# Patient Record
Sex: Male | Born: 1945 | Race: White | Hispanic: No | Marital: Married | Smoking: Never smoker
Health system: Southern US, Community
[De-identification: ages and names within clinical notes are randomized; demographics above are authoritative.]

## PROBLEM LIST (undated history)

## (undated) ENCOUNTER — Ambulatory Visit: Discharge status: Absent without leave | Payer: Medicare Other

## (undated) DIAGNOSIS — K209 Esophagitis, unspecified without bleeding: Secondary | ICD-10-CM

## (undated) DIAGNOSIS — E785 Hyperlipidemia, unspecified: Secondary | ICD-10-CM

## (undated) DIAGNOSIS — N529 Male erectile dysfunction, unspecified: Secondary | ICD-10-CM

## (undated) DIAGNOSIS — E119 Type 2 diabetes mellitus without complications: Secondary | ICD-10-CM

## (undated) DIAGNOSIS — H409 Unspecified glaucoma: Secondary | ICD-10-CM

## (undated) DIAGNOSIS — C801 Malignant (primary) neoplasm, unspecified: Secondary | ICD-10-CM

## (undated) DIAGNOSIS — K219 Gastro-esophageal reflux disease without esophagitis: Secondary | ICD-10-CM

## (undated) DIAGNOSIS — I10 Essential (primary) hypertension: Secondary | ICD-10-CM

## (undated) DIAGNOSIS — G4730 Sleep apnea, unspecified: Secondary | ICD-10-CM

## (undated) DIAGNOSIS — N139 Obstructive and reflux uropathy, unspecified: Secondary | ICD-10-CM

## (undated) HISTORY — PX: PROSTATE CRYOABLATION: SUR358

## (undated) HISTORY — PX: EYE SURGERY: SHX253

## (undated) HISTORY — PX: COLONOSCOPY: SHX174

---

## 2006-01-08 ENCOUNTER — Ambulatory Visit: Payer: Self-pay | Admitting: Family Medicine

## 2006-01-28 ENCOUNTER — Ambulatory Visit: Payer: Self-pay | Admitting: Psychiatry

## 2007-02-15 ENCOUNTER — Ambulatory Visit: Payer: Self-pay | Admitting: Gastroenterology

## 2007-03-15 DIAGNOSIS — C4491 Basal cell carcinoma of skin, unspecified: Secondary | ICD-10-CM

## 2007-03-15 HISTORY — DX: Basal cell carcinoma of skin, unspecified: C44.91

## 2007-10-31 DIAGNOSIS — L570 Actinic keratosis: Secondary | ICD-10-CM

## 2007-10-31 HISTORY — DX: Actinic keratosis: L57.0

## 2008-08-15 ENCOUNTER — Ambulatory Visit: Payer: Self-pay | Admitting: Urology

## 2008-10-10 ENCOUNTER — Ambulatory Visit: Payer: Self-pay | Admitting: Urology

## 2008-10-21 ENCOUNTER — Ambulatory Visit: Payer: Self-pay | Admitting: Urology

## 2009-10-23 ENCOUNTER — Ambulatory Visit: Payer: Self-pay | Admitting: *Deleted

## 2010-05-08 ENCOUNTER — Ambulatory Visit: Payer: Self-pay | Admitting: Internal Medicine

## 2010-12-17 ENCOUNTER — Ambulatory Visit: Payer: Self-pay | Admitting: Internal Medicine

## 2010-12-24 ENCOUNTER — Ambulatory Visit: Payer: Self-pay | Admitting: Internal Medicine

## 2011-07-19 ENCOUNTER — Ambulatory Visit: Payer: Self-pay | Admitting: Ophthalmology

## 2011-09-27 ENCOUNTER — Ambulatory Visit: Payer: Self-pay | Admitting: Ophthalmology

## 2012-12-10 DIAGNOSIS — C61 Malignant neoplasm of prostate: Secondary | ICD-10-CM | POA: Insufficient documentation

## 2012-12-10 DIAGNOSIS — N529 Male erectile dysfunction, unspecified: Secondary | ICD-10-CM | POA: Insufficient documentation

## 2012-12-10 DIAGNOSIS — R339 Retention of urine, unspecified: Secondary | ICD-10-CM | POA: Insufficient documentation

## 2012-12-10 DIAGNOSIS — N393 Stress incontinence (female) (male): Secondary | ICD-10-CM | POA: Insufficient documentation

## 2013-07-18 DIAGNOSIS — N139 Obstructive and reflux uropathy, unspecified: Secondary | ICD-10-CM | POA: Insufficient documentation

## 2013-07-18 DIAGNOSIS — R399 Unspecified symptoms and signs involving the genitourinary system: Secondary | ICD-10-CM | POA: Insufficient documentation

## 2014-07-23 DIAGNOSIS — Z8546 Personal history of malignant neoplasm of prostate: Secondary | ICD-10-CM | POA: Insufficient documentation

## 2014-08-18 DIAGNOSIS — E119 Type 2 diabetes mellitus without complications: Secondary | ICD-10-CM | POA: Insufficient documentation

## 2014-08-18 DIAGNOSIS — I10 Essential (primary) hypertension: Secondary | ICD-10-CM | POA: Insufficient documentation

## 2014-08-18 DIAGNOSIS — H409 Unspecified glaucoma: Secondary | ICD-10-CM | POA: Insufficient documentation

## 2014-08-18 DIAGNOSIS — E785 Hyperlipidemia, unspecified: Secondary | ICD-10-CM | POA: Insufficient documentation

## 2014-08-18 DIAGNOSIS — K219 Gastro-esophageal reflux disease without esophagitis: Secondary | ICD-10-CM | POA: Insufficient documentation

## 2016-10-08 ENCOUNTER — Encounter: Payer: Self-pay | Admitting: Gynecology

## 2016-10-08 ENCOUNTER — Ambulatory Visit
Admission: EM | Admit: 2016-10-08 | Discharge: 2016-10-08 | Disposition: A | Discharge status: Home / Self Care | Payer: Medicare Other | Attending: Family Medicine | Admitting: Family Medicine

## 2016-10-08 DIAGNOSIS — M5431 Sciatica, right side: Secondary | ICD-10-CM

## 2016-10-08 HISTORY — DX: Sleep apnea, unspecified: G47.30

## 2016-10-08 HISTORY — DX: Hyperlipidemia, unspecified: E78.5

## 2016-10-08 HISTORY — DX: Essential (primary) hypertension: I10

## 2016-10-08 HISTORY — DX: Obstructive and reflux uropathy, unspecified: N13.9

## 2016-10-08 HISTORY — DX: Male erectile dysfunction, unspecified: N52.9

## 2016-10-08 HISTORY — DX: Type 2 diabetes mellitus without complications: E11.9

## 2016-10-08 HISTORY — DX: Malignant (primary) neoplasm, unspecified: C80.1

## 2016-10-08 HISTORY — DX: Gastro-esophageal reflux disease without esophagitis: K21.9

## 2016-10-08 MED ORDER — PREDNISONE 20 MG PO TABS: 20.0000 mg | ORAL_TABLET | Freq: Every day | ORAL | 0 refills | Status: DC

## 2016-10-08 MED ORDER — HYDROCODONE-ACETAMINOPHEN 5-325 MG PO TABS: ORAL_TABLET | ORAL | 0 refills | Status: DC

## 2016-10-08 NOTE — ED Provider Notes (Signed)
[facility]    CSN: [id number] Arrival date & time: 10/08/16  0850     History   Chief Complaint Chief Complaint  Patient presents with  . Leg Pain    HPI Nicholas Acevedo is a 70 y.o. male.   70 yo male with a 2 days h/o right hip, buttock pain radiating down to the knee. States symptoms began when he was sitting on the commode. Denies any injuries, falls, fevers, chills, numbness/tingling, difficulty with bowels or bladder.    The history is provided by the patient.  Leg Pain    Past Medical History:  Diagnosis Date  . Cancer [facility])    prostate  . Diabetes mellitus without complication ([place])   . ED (erectile dysfunction)   . GERD (gastroesophageal reflux disease)   . Hyperlipemia   . Hypertension   . Impotence of organic origin   . Sleep apnea   . Urinary obstruction     There are no active problems to display for this patient.   Past Surgical History:  Procedure Laterality Date  . EYE SURGERY    . PROSTATE CRYOABLATION         Home Medications    Prior to Admission medications   Medication Sig Start Date End Date Taking? Authorizing Provider  aspirin EC 81 MG tablet Take 81 mg by mouth daily.   Yes Historical Provider, MD  brimonidine (ALPHAGAN) 0.2 % ophthalmic solution 3 (three) times daily.   Yes Historical Provider, MD  losartan (COZAAR) 50 MG tablet Take 50 mg by mouth daily.   Yes Historical Provider, MD  omeprazole (PRILOSEC) 40 MG capsule Take 40 mg by mouth daily.   Yes Historical Provider, MD  simvastatin (ZOCOR) 20 MG tablet Take 20 mg by mouth daily.   Yes Historical Provider, MD  sitaGLIPtin (JANUVIA) 25 MG tablet Take 25 mg by mouth daily.   Yes Historical Provider, MD  tamsulosin (FLOMAX) 0.4 MG CAPS capsule Take 0.4 mg by mouth.   Yes Historical Provider, MD  HYDROcodone-acetaminophen (NORCO/VICODIN) 5-325 MG tablet 1-2 tabs po q 8 hours prn 10/08/16   Norval Gable, MD  predniSONE (DELTASONE) 20 MG tablet Take 1 tablet (20  mg total) by mouth daily. 10/08/16   Norval Gable, MD    Family History No family history on file.  Social History Social History  Substance Use Topics  . Smoking status: Never Smoker  . Smokeless tobacco: Never Used  . Alcohol use No     Allergies   Review of patient's allergies indicates no known allergies.   Review of Systems Review of Systems   Physical Exam Triage Vital Signs ED Triage Vitals  Enc Vitals Group     BP 10/08/16 0940 (!) 160/89     Pulse Rate 10/08/16 0940 66     Resp 10/08/16 0940 16     Temp 10/08/16 0940 98.2 F (36.8 C)     Temp Source 10/08/16 0940 Oral     SpO2 10/08/16 0940 98 %     Weight 10/08/16 0942 277 lb (125.6 kg)     Height 10/08/16 0942 5\' 11"  (1.803 m)     Head Circumference --      Peak Flow --      Pain Score 10/08/16 0948 8     Pain Loc --      Pain Edu? --      Excl. in [place]? --    No data found.   Updated Vital Signs BP Marland Kitchen)  160/89 (BP Location: Left Arm)   Pulse 66   Temp 98.2 F (36.8 C) (Oral)   Resp 16   Ht 5\' 11"  (1.803 m)   Wt 277 lb (125.6 kg)   SpO2 98%   BMI 38.63 kg/m   Visual Acuity Right Eye Distance:   Left Eye Distance:   Bilateral Distance:    Right Eye Near:   Left Eye Near:    Bilateral Near:     Physical Exam  Constitutional: He appears well-developed and well-nourished. No distress.  Neck: Normal range of motion. Neck supple. No tracheal deviation present.  Pulmonary/Chest: Effort normal. No stridor. No respiratory distress.  Musculoskeletal:       Cervical back: He exhibits no tenderness, no bony tenderness, no swelling, no edema, no deformity, no laceration, no pain, no spasm and normal pulse.       Lumbar back: He exhibits tenderness (over the right lumbar paraspinous and buttock) and spasm. He exhibits normal range of motion, no bony tenderness, no swelling, no edema, no deformity, no laceration, no pain and normal pulse.  Neurological: He is alert. He has normal reflexes. He  displays normal reflexes. He exhibits normal muscle tone. Coordination normal.  Skin: No rash noted. He is not diaphoretic.  Nursing note and vitals reviewed.    UC Treatments / Results  Labs (all labs ordered are listed, but only abnormal results are displayed) Labs Reviewed - No data to display  EKG  EKG Interpretation None       Radiology No results found.  Procedures Procedures (including critical care time)  Medications Ordered in UC Medications - No data to display   Initial Impression / Assessment and Plan / UC Course  I have reviewed the triage vital signs and the nursing notes.  Pertinent labs & imaging results that were available during my care of the patient were reviewed by me and considered in my medical decision making (see chart for details).  Clinical Course      Final Clinical Impressions(s) / UC Diagnoses   Final diagnoses:  Sciatica of right side    New Prescriptions Discharge Medication List as of 10/08/2016 10:08 AM    START taking these medications   Details  HYDROcodone-acetaminophen (NORCO/VICODIN) 5-325 MG tablet 1-2 tabs po q 8 hours prn, Print    predniSONE (DELTASONE) 20 MG tablet Take 1 tablet (20 mg total) by mouth daily., Starting Sat 10/08/2016, Normal       1. diagnosis reviewed with patient 2. rx as per orders above; reviewed possible side effects, interactions, risks and benefits  3. Recommend supportive treatment with heat/ice, stretches 4. Follow-up prn if symptoms worsen or don't improve   Norval Gable, MD 10/08/16 1143

## 2016-10-08 NOTE — ED Triage Notes (Signed)
Per patient c/o pain start in right hip rotate down to his right leg.

## 2016-11-30 ENCOUNTER — Other Ambulatory Visit: Payer: Self-pay | Admitting: Family Medicine

## 2016-11-30 DIAGNOSIS — M5440 Lumbago with sciatica, unspecified side: Principal | ICD-10-CM

## 2016-11-30 DIAGNOSIS — G8929 Other chronic pain: Secondary | ICD-10-CM

## 2016-12-15 ENCOUNTER — Ambulatory Visit
Admission: RE | Admit: 2016-12-15 | Discharge: 2016-12-15 | Disposition: A | Discharge status: Home / Self Care | Payer: Medicare Other | Source: Ambulatory Visit | Attending: Family Medicine | Admitting: Family Medicine

## 2016-12-15 DIAGNOSIS — Q7649 Other congenital malformations of spine, not associated with scoliosis: Secondary | ICD-10-CM | POA: Diagnosis not present

## 2016-12-15 DIAGNOSIS — M48061 Spinal stenosis, lumbar region without neurogenic claudication: Secondary | ICD-10-CM | POA: Insufficient documentation

## 2016-12-15 DIAGNOSIS — G8929 Other chronic pain: Secondary | ICD-10-CM | POA: Diagnosis not present

## 2016-12-15 DIAGNOSIS — M5126 Other intervertebral disc displacement, lumbar region: Secondary | ICD-10-CM | POA: Diagnosis not present

## 2016-12-15 DIAGNOSIS — M5440 Lumbago with sciatica, unspecified side: Secondary | ICD-10-CM | POA: Diagnosis not present

## 2017-09-11 ENCOUNTER — Encounter: Payer: Self-pay | Admitting: *Deleted

## 2017-09-12 ENCOUNTER — Ambulatory Visit: Payer: Medicare Other | Admitting: Anesthesiology

## 2017-09-12 ENCOUNTER — Encounter
Admission: RE | Disposition: A | Discharge status: Home / Self Care | Payer: Self-pay | Source: Ambulatory Visit | Attending: Gastroenterology

## 2017-09-12 ENCOUNTER — Ambulatory Visit
Admission: RE | Admit: 2017-09-12 | Discharge: 2017-09-12 | Disposition: A | Discharge status: Home / Self Care | Payer: Medicare Other | Source: Ambulatory Visit | Attending: Gastroenterology | Admitting: Gastroenterology

## 2017-09-12 DIAGNOSIS — Q439 Congenital malformation of intestine, unspecified: Secondary | ICD-10-CM | POA: Insufficient documentation

## 2017-09-12 DIAGNOSIS — I10 Essential (primary) hypertension: Secondary | ICD-10-CM | POA: Diagnosis not present

## 2017-09-12 DIAGNOSIS — Z8546 Personal history of malignant neoplasm of prostate: Secondary | ICD-10-CM | POA: Diagnosis not present

## 2017-09-12 DIAGNOSIS — Z7984 Long term (current) use of oral hypoglycemic drugs: Secondary | ICD-10-CM | POA: Insufficient documentation

## 2017-09-12 DIAGNOSIS — Z9989 Dependence on other enabling machines and devices: Secondary | ICD-10-CM | POA: Diagnosis not present

## 2017-09-12 DIAGNOSIS — E119 Type 2 diabetes mellitus without complications: Secondary | ICD-10-CM | POA: Diagnosis not present

## 2017-09-12 DIAGNOSIS — Z79899 Other long term (current) drug therapy: Secondary | ICD-10-CM | POA: Insufficient documentation

## 2017-09-12 DIAGNOSIS — E785 Hyperlipidemia, unspecified: Secondary | ICD-10-CM | POA: Diagnosis not present

## 2017-09-12 DIAGNOSIS — K5730 Diverticulosis of large intestine without perforation or abscess without bleeding: Secondary | ICD-10-CM | POA: Insufficient documentation

## 2017-09-12 DIAGNOSIS — D120 Benign neoplasm of cecum: Secondary | ICD-10-CM | POA: Diagnosis not present

## 2017-09-12 DIAGNOSIS — Z7982 Long term (current) use of aspirin: Secondary | ICD-10-CM | POA: Diagnosis not present

## 2017-09-12 DIAGNOSIS — K219 Gastro-esophageal reflux disease without esophagitis: Secondary | ICD-10-CM | POA: Insufficient documentation

## 2017-09-12 DIAGNOSIS — H409 Unspecified glaucoma: Secondary | ICD-10-CM | POA: Insufficient documentation

## 2017-09-12 DIAGNOSIS — Z1211 Encounter for screening for malignant neoplasm of colon: Secondary | ICD-10-CM | POA: Insufficient documentation

## 2017-09-12 DIAGNOSIS — G4730 Sleep apnea, unspecified: Secondary | ICD-10-CM | POA: Insufficient documentation

## 2017-09-12 HISTORY — DX: Unspecified glaucoma: H40.9

## 2017-09-12 HISTORY — PX: COLONOSCOPY WITH PROPOFOL: SHX5780

## 2017-09-12 HISTORY — DX: Esophagitis, unspecified without bleeding: K20.90

## 2017-09-12 HISTORY — DX: Esophagitis, unspecified: K20.9

## 2017-09-12 LAB — GLUCOSE, CAPILLARY: GLUCOSE-CAPILLARY: 116 mg/dL — AB (ref 65–99)

## 2017-09-12 SURGERY — COLONOSCOPY WITH PROPOFOL
Anesthesia: General

## 2017-09-12 MED ORDER — LIDOCAINE HCL (PF) 2 % IJ SOLN
INTRAMUSCULAR | Status: AC
  Filled 2017-09-12: qty 4

## 2017-09-12 MED ORDER — PROPOFOL 500 MG/50ML IV EMUL
INTRAVENOUS | Status: DC | PRN
  Administered 2017-09-12: 125 ug/kg/min via INTRAVENOUS

## 2017-09-12 MED ORDER — SODIUM CHLORIDE 0.9 % IV SOLN
INTRAVENOUS | Status: DC
  Administered 2017-09-12: 09:00:00 via INTRAVENOUS

## 2017-09-12 MED ORDER — PROPOFOL 500 MG/50ML IV EMUL
INTRAVENOUS | Status: AC
  Filled 2017-09-12: qty 50

## 2017-09-12 MED ORDER — PROPOFOL 10 MG/ML IV BOLUS
INTRAVENOUS | Status: DC | PRN
  Administered 2017-09-12: 100 mg via INTRAVENOUS

## 2017-09-12 MED ORDER — LIDOCAINE HCL (CARDIAC) 20 MG/ML IV SOLN
INTRAVENOUS | Status: DC | PRN
  Administered 2017-09-12: 80 mg via INTRAVENOUS

## 2017-09-12 NOTE — Anesthesia Preprocedure Evaluation (Signed)
Anesthesia Evaluation  Patient identified by MRN, date of birth, ID band Patient awake    Reviewed: Allergy & Precautions, H&P , NPO status , Patient's Chart, lab work & pertinent test results, reviewed documented beta blocker date and time   Airway Mallampati: III  TM Distance: >3 FB Neck ROM: full    Dental  (+) Partial Upper, Partial Lower   Pulmonary neg shortness of breath, sleep apnea and Continuous Positive Airway Pressure Ventilation , neg COPD, neg recent URI,           Cardiovascular Exercise Tolerance: Good hypertension, (-) angina(-) CAD, (-) Past MI, (-) Cardiac Stents and (-) CABG (-) dysrhythmias (-) Valvular Problems/Murmurs     Neuro/Psych negative neurological ROS  negative psych ROS   GI/Hepatic Neg liver ROS, GERD  ,  Endo/Other  diabetes  Renal/GU negative Renal ROS  negative genitourinary   Musculoskeletal   Abdominal   Peds  Hematology negative hematology ROS (+)   Anesthesia Other Findings Past Medical History: No date: Cancer ([place])     Comment:  prostate No date: Diabetes mellitus without complication ([facility]) No date: ED (erectile dysfunction) No date: Esophagitis No date: GERD (gastroesophageal reflux disease) No date: Glaucoma No date: Hyperlipemia No date: Hypertension 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 No date: Sleep apnea No date: Urinary obstruction   Reproductive/Obstetrics negative OB ROS                             Anesthesia Physical Anesthesia Plan  ASA: III  Anesthesia Plan: General   Post-op Pain Management:    Induction: Intravenous  PONV Risk Score and Plan: 2 and Propofol infusion  Airway Management Planned: Natural Airway and Nasal Cannula  Additional Equipment:   Intra-op Plan:   Post-operative Plan:   Informed Consent: I have reviewed the patients History and Physical, chart, labs and discussed the procedure including the  risks, benefits and alternatives for the proposed anesthesia with the patient or authorized representative who has indicated his/her understanding and acceptance.   Dental Advisory Given  Plan Discussed with: Anesthesiologist, CRNA and Surgeon  Anesthesia Plan Comments:         Anesthesia Quick Evaluation

## 2017-09-12 NOTE — H&P (Signed)
Outpatient short stay form Pre-procedure 09/12/2017 9:55 AM Lollie Sails MD  Primary Physician: Dr. Thereasa Distance  Reason for visit:  Colonoscopy  History of present illness:  Patient is a 71 year old male presenting today as above. His last colonoscopy was about 10 years ago by Dr. Sonny Masters. He tolerated his prep well. He does take 81 mg aspirin but has not for a couple days. He takes no other aspirin products or blood thinning agents.    Current Facility-Administered Medications:  .  0.9 %  sodium chloride infusion, , Intravenous, Continuous, Lollie Sails, MD, Last Rate: 20 mL/hr at 09/12/17 7616  Prescriptions Prior to Admission  Medication Sig Dispense Refill Last Dose  . aspirin EC 81 MG tablet Take 81 mg by mouth daily.   09/11/2017 at Unknown time  . brimonidine (ALPHAGAN) 0.2 % ophthalmic solution 3 (three) times daily.   09/11/2017 at Unknown time  . latanoprost (XALATAN) 0.005 % ophthalmic solution 1 drop at bedtime.   09/11/2017 at Unknown time  . losartan (COZAAR) 50 MG tablet Take 50 mg by mouth daily.   09/11/2017 at Unknown time  . metFORMIN (GLUCOPHAGE) 1000 MG tablet Take 1,000 mg by mouth 2 (two) times daily with a meal.   09/11/2017 at Unknown time  . omeprazole (PRILOSEC) 40 MG capsule Take 40 mg by mouth daily.   09/11/2017 at Unknown time  . simvastatin (ZOCOR) 20 MG tablet Take 20 mg by mouth daily.   09/11/2017 at Unknown time  . sitaGLIPtin (JANUVIA) 25 MG tablet Take 25 mg by mouth daily.   09/11/2017 at Unknown time  . tamsulosin (FLOMAX) 0.4 MG CAPS capsule Take 0.4 mg by mouth.   09/11/2017 at Unknown time  . HYDROcodone-acetaminophen (NORCO/VICODIN) 5-325 MG tablet 1-2 tabs po q 8 hours prn (Patient not taking: Reported on 09/12/2017) 12 tablet 0 Completed Course at Unknown time  . predniSONE (DELTASONE) 20 MG tablet Take 1 tablet (20 mg total) by mouth daily. (Patient not taking: Reported on 09/12/2017) 5 tablet 0 Completed Course at Unknown time     No  Known Allergies   Past Medical History:  Diagnosis Date  . Cancer [facility])    prostate  . Diabetes mellitus without complication ([place])   . ED (erectile dysfunction)   . Esophagitis   . GERD (gastroesophageal reflux disease)   . Glaucoma   . Hyperlipemia   . Hypertension   . Impotence of organic origin   . Sleep apnea   . Urinary obstruction     Review of systems:      Physical Exam    Heart and lungs: Regular rate and rhythm without rub or gallop, lungs are bilaterally clear.    HEENT: Normocephalic atraumatic eyes are anicteric    Other:     Pertinant exam for procedure: Soft nontender nondistended bowel sounds positive normoactive    Planned proceedures: Colonoscopy and indicated procedures.  Lollie Sails, MD Gastroenterology 09/12/2017  9:55 AM

## 2017-09-12 NOTE — Op Note (Signed)
[facility] Patient Name: Nicholas Acevedo Procedure Date: 09/12/2017 9:56 AM MRN: [id number] Account #: [id number] Date of Birth: [birth year] Admit Type: Outpatient Age: 72 Room: [facility] ENDO ROOM 3 Gender: Male Note Status: Finalized Procedure:            Colonoscopy Indications:          Screening for colorectal malignant neoplasm Providers:            Lollie Sails, MD Referring MD:         [name] (Referring MD) Medicines:            Monitored Anesthesia Care Complications:        No immediate complications. Procedure:            Pre-Anesthesia Assessment:                       - ASA Grade Assessment: III - A patient with severe                        systemic disease.                       After obtaining informed consent, the colonoscope was                        passed under direct vision. Throughout the procedure,                        the patient's blood pressure, pulse, and oxygen                        saturations were monitored continuously. The                        Colonoscope was introduced through the anus and                        advanced to the the cecum, identified by appendiceal                        orifice and ileocecal valve. The colonoscopy was                        unusually difficult due to multiple diverticula in the                        colon and a tortuous colon. Successful completion of                        the procedure was aided by lavage. The patient                        tolerated the procedure well. The quality of the bowel                        preparation was good except the sigmoid colon was fair. Findings:      Multiple small and large-mouthed diverticula were found in the sigmoid       colon and descending colon.      A 1 mm polyp was found in the cecum. The  polyp was sessile. The polyp       was removed with a cold biopsy forceps. Resection and retrieval were       complete.  The retroflexed view of the distal rectum and anal verge was normal and       showed no anal or rectal abnormalities.      The digital rectal exam was normal. Impression:           - Diverticulosis in the sigmoid colon and in the                        descending colon.                       - One 1 mm polyp in the cecum, removed with a cold                        biopsy forceps. Resected and retrieved.                       - The distal rectum and anal verge are normal on                        retroflexion view. Recommendation:       - Discharge patient to home.                       - Telephone GI clinic for pathology results in 1 week. Procedure Code(s):    --- Professional ---                       [phone], Colonoscopy, flexible; with biopsy, single or                        multiple Diagnosis Code(s):    --- Professional ---                       Z12.11, Encounter for screening for malignant neoplasm                        of colon                       D12.0, Benign neoplasm of cecum                       K57.30, Diverticulosis of large intestine without                        perforation or abscess without bleeding CPT copyright 2016 American [employer] Association. All rights reserved. The codes documented in this report are preliminary and upon coder review may  be revised to meet current compliance requirements. Lollie Sails, MD 09/12/2017 10:39:14 AM This report has been signed electronically. Number of Addenda: 0 Note Initiated On: 09/12/2017 9:56 AM Scope Withdrawal Time: 0 hours 10 minutes 55 seconds  Total Procedure Duration: 0 hours 25 minutes 5 seconds       [facility]

## 2017-09-12 NOTE — Transfer of Care (Signed)
Immediate Anesthesia Transfer of Care Note  Patient: Nicholas Acevedo  Procedure(s) Performed: Procedure(s): COLONOSCOPY WITH PROPOFOL (N/A)  Patient Location: Endoscopy Unit  Anesthesia Type:General  Level of Consciousness: sedated  Airway & Oxygen Therapy: Patient Spontanous Breathing and Patient connected to nasal cannula oxygen  Post-op Assessment: Report given to RN and Post -op Vital signs reviewed and stable  Post vital signs: Reviewed and stable  Last Vitals:  Vitals:   09/12/17 0924 09/12/17 1041  BP: (!) 144/92 104/63  Pulse: 65 66  Resp: 18 10  Temp: (!) 35.8 C (!) 35.9 C  SpO2: 95% 98%    Last Pain:  Vitals:   09/12/17 1041  TempSrc: Tympanic         Complications: No apparent anesthesia complications

## 2017-09-12 NOTE — Anesthesia Post-op Follow-up Note (Signed)
Anesthesia QCDR form completed.        

## 2017-09-13 ENCOUNTER — Encounter: Payer: Self-pay | Admitting: Gastroenterology

## 2017-09-13 LAB — SURGICAL PATHOLOGY

## 2017-09-13 NOTE — Anesthesia Postprocedure Evaluation (Signed)
Anesthesia Post Note  Patient: SANDERS MANNINEN  Procedure(s) Performed: Procedure(s) (LRB): COLONOSCOPY WITH PROPOFOL (N/A)  Patient location during evaluation: Endoscopy Anesthesia Type: General Level of consciousness: awake and alert Pain management: pain level controlled Vital Signs Assessment: post-procedure vital signs reviewed and stable Respiratory status: spontaneous breathing, nonlabored ventilation, respiratory function stable and patient connected to nasal cannula oxygen Cardiovascular status: blood pressure returned to baseline and stable Postop Assessment: no apparent nausea or vomiting Anesthetic complications: no     Last Vitals:  Vitals:   09/12/17 1101 09/12/17 1111  BP: (!) 138/93 (!) 147/90  Pulse: (!) 59 (!) 59  Resp: 20 18  Temp:    SpO2: 98% 100%    Last Pain:  Vitals:   09/12/17 1041  TempSrc: Tympanic                 Martha Clan

## 2018-09-13 IMAGING — MR MR LUMBAR SPINE W/O CM
4 of 5 series · 24 of 48 positions shown · non-contrast
Comparison: 05/08/2010 plain film exam of the lumbar spine.

CLINICAL DATA: 70-year-old male with right hip pain for 9 weeks. No
known injury. Prostate cancer. Initial encounter.

EXAM:
MRI LUMBAR SPINE WITHOUT CONTRAST
TECHNIQUE: Multiplanar, multisequence MR imaging of the lumbar spine was
performed. No intravenous contrast was administered.

[Series 2: T2 · sagittal · 4.0mm · 0.81mm/px · 6 of 15 slices shown (1 of 2)]
[im 1/15]
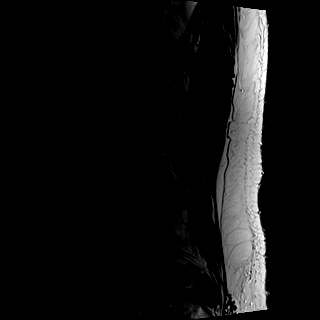
[im 3/15]
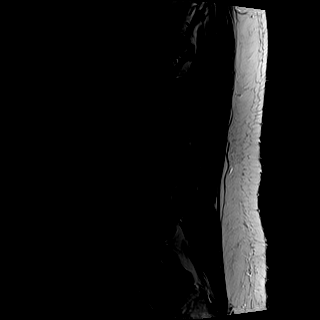
[im 6/15]
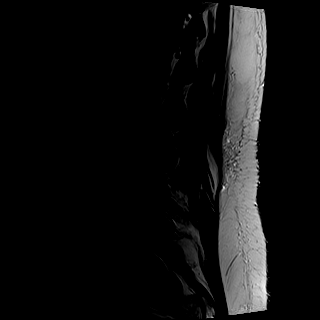
[im 9/15]
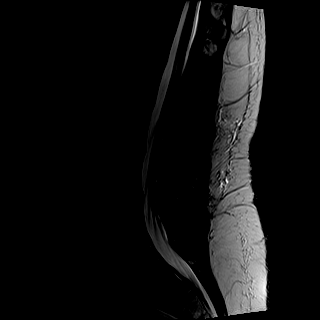
[im 12/15]
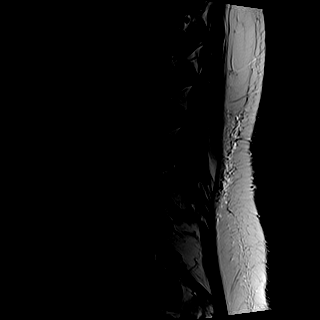
[im 15/15]
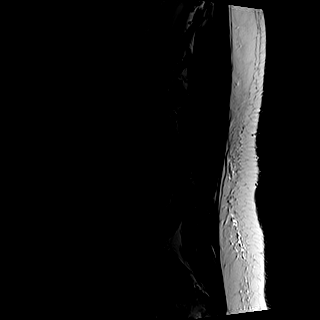

[Series 3: T1 · sagittal · 4.0mm · 0.41mm/px · 6 of 15 slices shown (1 of 2)]
[im 1/15]
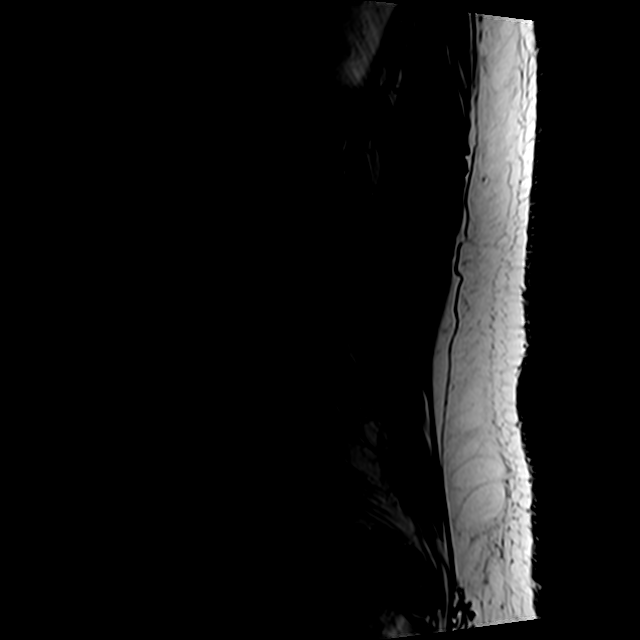
[im 3/15]
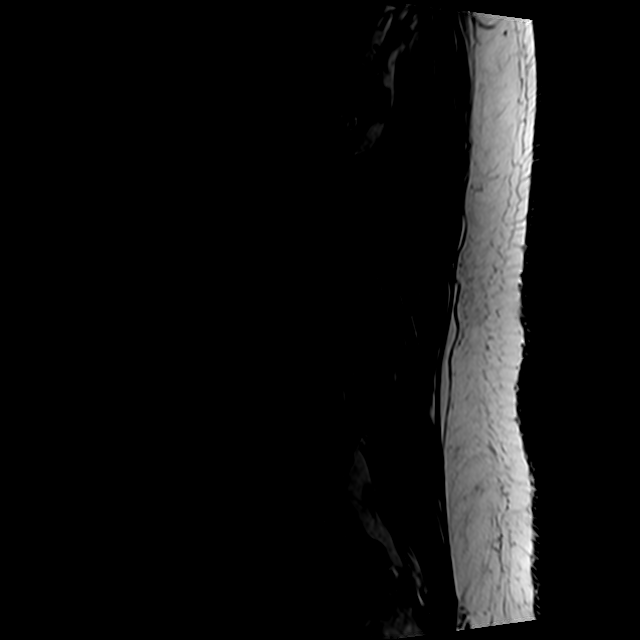
[im 6/15]
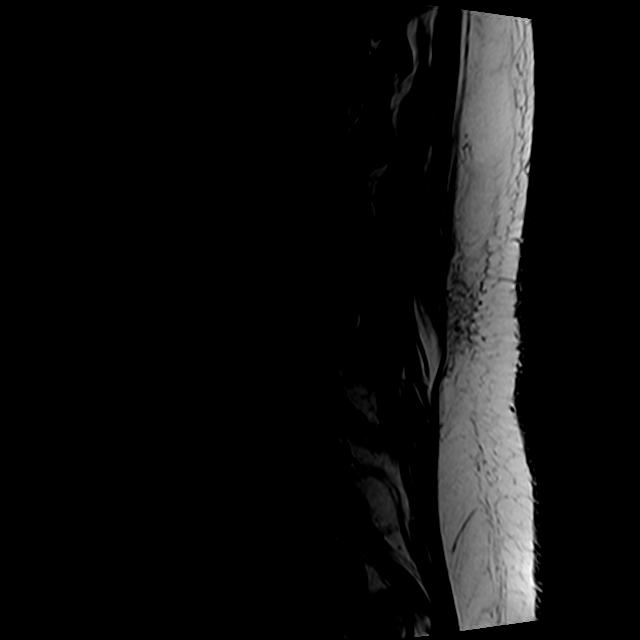
[im 9/15]
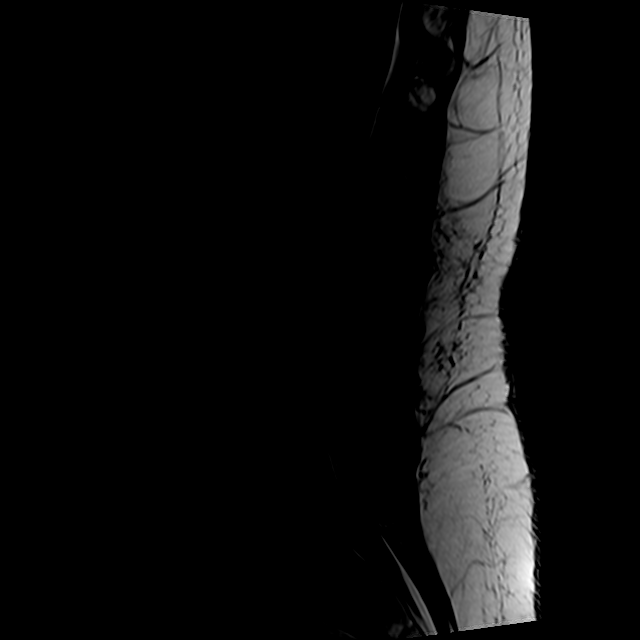
[im 12/15]
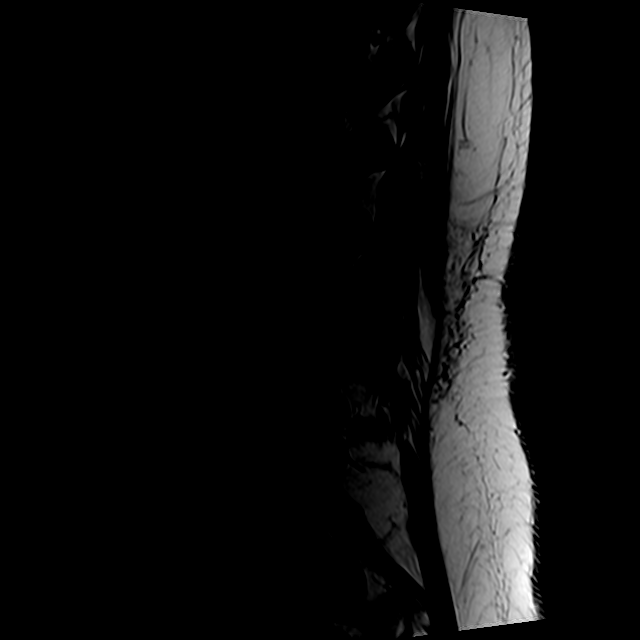
[im 15/15]
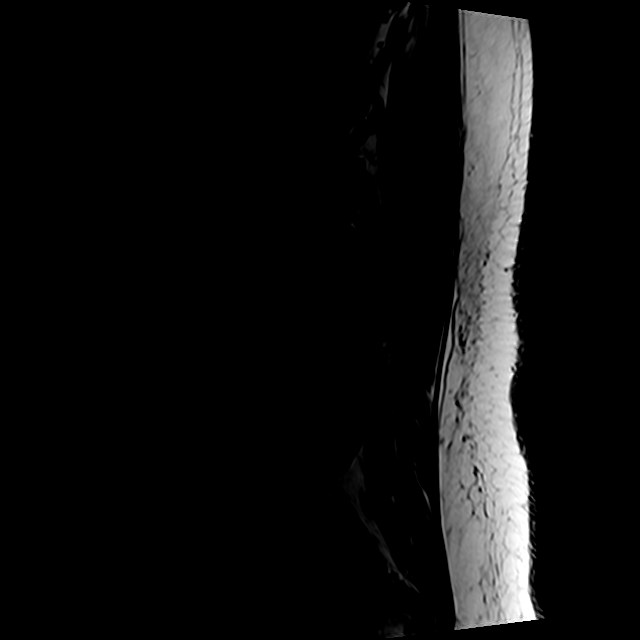

[Series 5: T2 · axial · 4.0mm · 0.78mm/px · z∈[+4,+235]mm · 9 of 41 slices shown (2 of 2)]
[im 1/41]
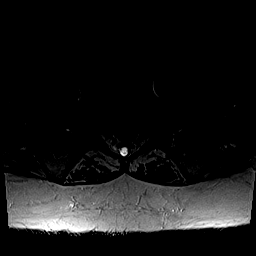
[im 6/41]
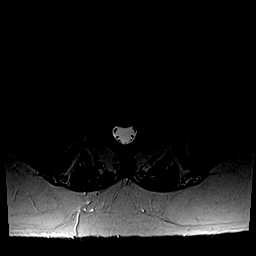
[im 12/41]
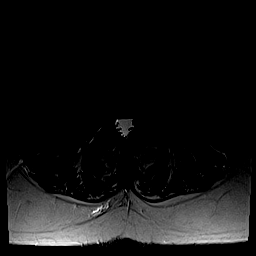
[im 18/41]
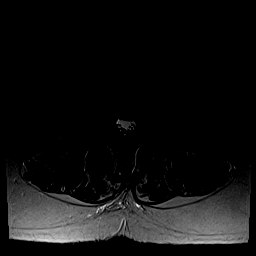
[im 21/41]
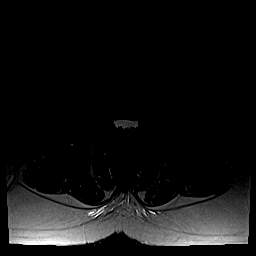
[im 23/41]
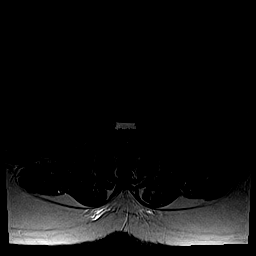
[im 29/41]
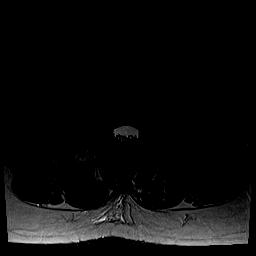
[im 35/41]
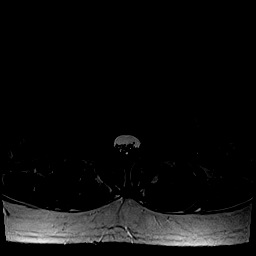
[im 41/41]
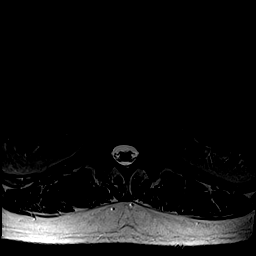

[Series 6: T1 · axial · 4.0mm · 0.31mm/px · z∈[+28,+205]mm · 3 of 41 slices shown (2 of 2)]
[im 6/41]
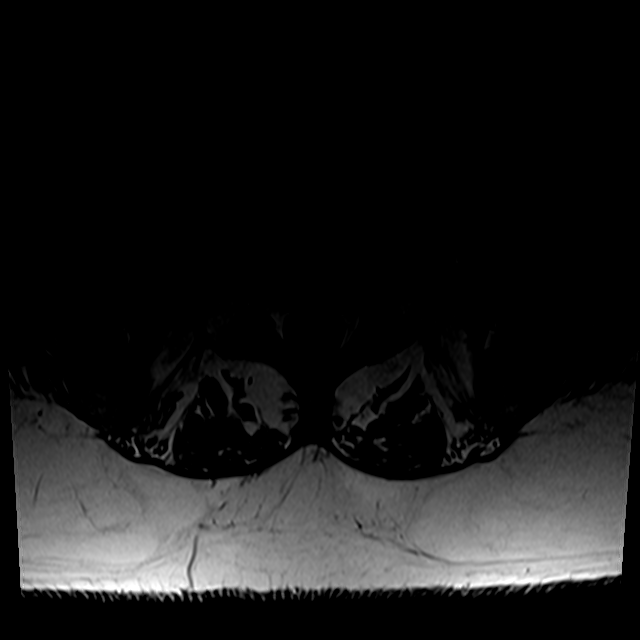
[im 21/41]
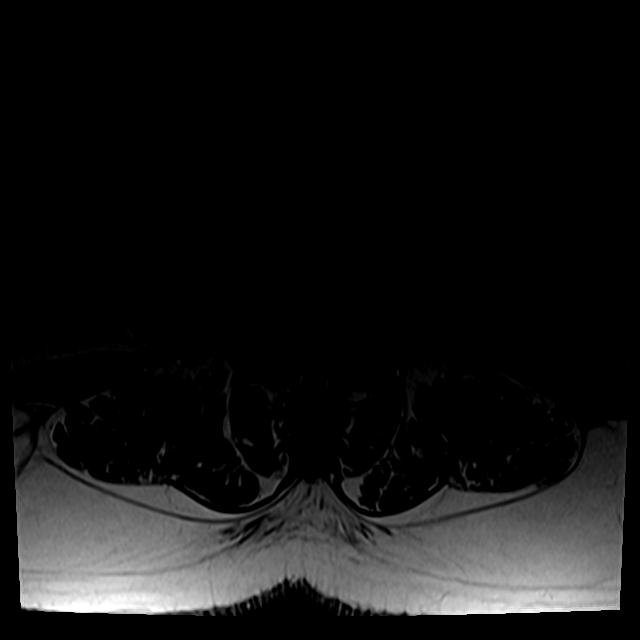
[im 35/41]
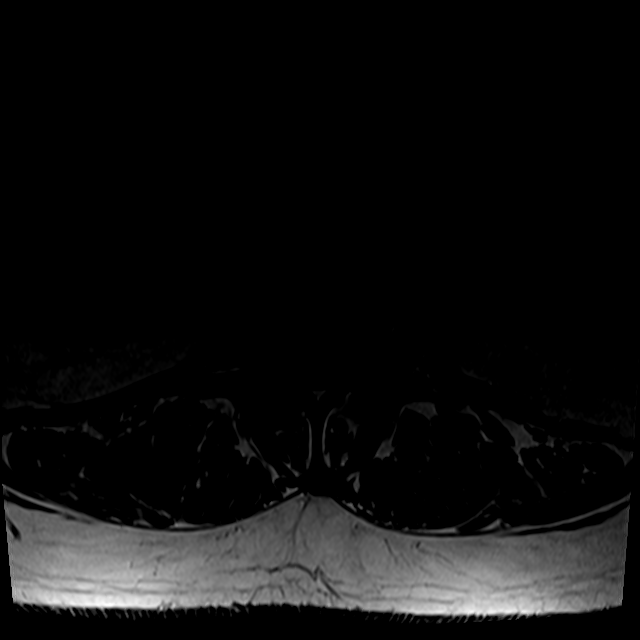

[24 of 48 positions shown; findings below may reference images not displayed]

FINDINGS: Segmentation: Transitional vertebra. On the prior plain film
examination, ribs are seen at the T12 level with almost completely
sacralization of the L5 vertebral body. This level assignment will
need to be understood by anyone involved in the patient's future
care.

Utilizing this level assignment, the present examination
incorporates from mid T11 to S2 level.

Alignment:  Within normal limits.

Vertebrae:  No worrisome osseous abnormality.

Conus medullaris: Extends to the L1-2 disc space level and appears
normal.

Paraspinal and other soft tissues: No worrisome abnormality.

Disc levels:

T11-12: Sagittal imaging only. Minimal Schmorl's node deformity and
anterior osteophyte.

T12-L1:  No spinal stenosis or foraminal narrowing.

L1-2: Minimal facet degenerative changes. No spinal stenosis or
foraminal narrowing.

L2-3: Mild bulge. Mild facet degenerative changes. Very mild spinal
stenosis.

L3-4: Moderate bulge/ shallow central minimally caudally extending
protrusion. Facet degenerative changes. Ligamentum flavum
hypertrophy. Short pedicles. Multifactorial mild to slightly
moderate spinal stenosis and lateral recess narrowing slightly
greater on right. Mild bilateral foraminal narrowing.

L4-5: Disc degeneration. Baseline bulge with superimposed moderate
size right paracentral caudally extending disc fragment with mass
effect upon the right lateral thecal sac and right L5 nerve root.

L5-S1: Rudimentary disc without spinal stenosis or foraminal
narrowing.
IMPRESSION: Transitional vertebra. On the prior plain film examination, ribs are
seen at the T12 level with almost completely sacralization of the L5
vertebral body. This level assignment will need to be understood by
anyone involved in the patient's future care.

Utilizing this level assignment, the present examination
incorporates from mid T11 to S2 level.

L4-5 baseline bulge with superimposed moderate size right
paracentral caudally extending disc fragment with mass effect upon
the right lateral thecal sac and right L5 nerve root.

L3-4 multifactorial mild to slightly moderate spinal stenosis and
lateral recess narrowing slightly greater on right. Mild bilateral
foraminal narrowing.

L2-3 very mild spinal stenosis

L5-S1: Rudimentary disc without spinal stenosis or foraminal
narrowing.

## 2018-09-17 ENCOUNTER — Ambulatory Visit: Payer: Self-pay | Admitting: Urology

## 2018-10-15 ENCOUNTER — Encounter: Payer: Self-pay | Admitting: Urology

## 2018-10-15 ENCOUNTER — Ambulatory Visit (INDEPENDENT_AMBULATORY_CARE_PROVIDER_SITE_OTHER): Payer: Medicare Other | Admitting: Urology

## 2018-10-15 VITALS — BP 135/77 | HR 76 | Ht 71.0 in | Wt 269.4 lb

## 2018-10-15 DIAGNOSIS — Z8546 Personal history of malignant neoplasm of prostate: Secondary | ICD-10-CM | POA: Diagnosis not present

## 2018-10-15 NOTE — Progress Notes (Signed)
10/15/2018 11:06 AM   Nicholas Acevedo Jun 18, 1946 [id number]  Referring provider: [name], MD Nicholas Acevedo, [name] [address]  Chief Complaint  Patient presents with  . Establish Care   Urologic history: 1.  T1c adenocarcinoma prostate -Biopsy August 2009 PSA 3.8; 24 cc gland -Prostate cryoablation 09/2008  HPI: 72 year old male presents to establish local urologic care.  He has been followed by Nicholas Acevedo for over 10 years.  He last saw him December 2018 and PSA was 0.51 at that visit.  He has no bothersome lower urinary tract symptoms.  Denies dysuria or gross hematuria.  Denies flank, abdominal, pelvic or scrotal pain.   PMH: Past Medical History:  Diagnosis Date  . Cancer [facility])    prostate  . Diabetes mellitus without complication ([place])   . ED (erectile dysfunction)   . Esophagitis   . GERD (gastroesophageal reflux disease)   . Glaucoma   . Hyperlipemia   . Hypertension   . Impotence of organic origin   . Sleep apnea   . Urinary obstruction     Surgical History: Past Surgical History:  Procedure Laterality Date  . COLONOSCOPY    . COLONOSCOPY WITH PROPOFOL N/A 09/12/2017   Procedure: COLONOSCOPY WITH PROPOFOL;  Surgeon: Nicholas Sails, MD;  Location: [facility] ENDOSCOPY;  Service: Endoscopy;  Laterality: N/A;  . EYE SURGERY    . PROSTATE CRYOABLATION      Home Medications:  Allergies as of 10/15/2018   No Known Allergies     Medication List        Accurate as of 10/15/18 11:06 AM. Always use your most recent med list.          aspirin EC 81 MG tablet Take 81 mg by mouth daily.   hydrocortisone 2.5 % cream APPLY 1 TO 2 APPLICATIONS ON THE SKIN DAILY APPLY TO AFFECTED AREA OF FACE AS NEEDED FLARES   latanoprost 0.005 % ophthalmic solution Commonly known as:  XALATAN 1 drop at bedtime.   losartan 50 MG tablet Commonly known as:  COZAAR Take 50 mg by mouth daily.   metFORMIN 1000 MG tablet Commonly known as:  GLUCOPHAGE Take  1,000 mg by mouth 2 (two) times daily with a meal.   omeprazole 40 MG capsule Commonly known as:  PRILOSEC Take 40 mg by mouth daily.   ONE TOUCH ULTRA TEST test strip Generic drug:  glucose blood USE ONE STRIP TO CHECK GLUCOSE ONCE DAILY   simvastatin 20 MG tablet Commonly known as:  ZOCOR Take 20 mg by mouth daily.   sitaGLIPtin 25 MG tablet Commonly known as:  JANUVIA Take 25 mg by mouth daily.   tamsulosin 0.4 MG Caps capsule Commonly known as:  FLOMAX Take 0.4 mg by mouth.       Allergies: No Known Allergies  Family History: No family history on file.  Social History:  reports that he has never smoked. He has never used smokeless tobacco. He reports that he does not drink alcohol or use drugs.  ROS: UROLOGY Frequent Urination?: Yes Hard to postpone urination?: Yes Burning/pain with urination?: No Get up at night to urinate?: Yes Leakage of urine?: Yes Urine stream starts and stops?: No Trouble starting stream?: No Do you have to strain to urinate?: No Blood in urine?: No Urinary tract infection?: No Sexually transmitted disease?: No Injury to kidneys or bladder?: No Painful intercourse?: No Weak stream?: No Erection problems?: Yes Penile pain?: No  Gastrointestinal Nausea?: No Vomiting?: No  Indigestion/heartburn?: No Diarrhea?: No Constipation?: No  Constitutional Fever: No Night sweats?: No Weight loss?: No Fatigue?: No  Skin Skin rash/lesions?: No Itching?: No  Eyes Blurred vision?: No Double vision?: No  Ears/Nose/Throat Sore throat?: No Sinus problems?: No  Hematologic/Lymphatic Swollen glands?: No Easy bruising?: No  Cardiovascular Leg swelling?: Yes Chest pain?: No  Respiratory Cough?: No Shortness of breath?: No  Endocrine Excessive thirst?: No  Musculoskeletal Back pain?: No Joint pain?: No  Neurological Headaches?: No Dizziness?: No  Psychologic Depression?: No Anxiety?: No  Physical Exam: BP 135/77  (BP Location: Left Arm, Patient Position: Sitting, Cuff Size: Large)   Pulse 76   Ht 5\' 11"  (1.803 m)   Wt 269 lb 6.4 oz (122.2 kg)   BMI 37.57 kg/m   Constitutional:  Alert and oriented, No acute distress. HEENT: [place] AT, moist mucus membranes.  Trachea midline, no masses. Cardiovascular: No clubbing, cyanosis, or edema. Respiratory: Normal respiratory effort, no increased work of breathing. GI: Abdomen is soft, nontender, nondistended, no abdominal masses GU: No CVA tenderness.  Prostate small, smooth, barely palpable Lymph: No cervical or inguinal lymphadenopathy. Skin: No rashes, bruises or suspicious lesions. Neurologic: Grossly intact, no focal deficits, moving all 4 extremities. Psychiatric: Normal mood and affect.   Assessment & Plan:    1. Personal history of prostate cancer He is 10 years out from prostate cryoablation with a low PSA.  PSA was ordered today and if stable he will continue annual follow-up.  Return in about 1 year (around 10/16/2019) for Recheck, PSA.   Nicholas Acevedo, [place] [address] [place], [place] [address] [phone]

## 2018-10-16 LAB — PSA: PROSTATE SPECIFIC AG, SERUM: 0.2 ng/mL (ref 0.0–4.0)

## 2018-10-17 ENCOUNTER — Telehealth: Payer: Self-pay

## 2018-10-17 NOTE — Telephone Encounter (Signed)
-----   Message from Abbie Sons, MD sent at 10/17/2018  8:14 AM EDT ----- PSA looks good at 0.2

## 2018-10-17 NOTE — Telephone Encounter (Signed)
Pt informed

## 2019-02-12 DIAGNOSIS — E669 Obesity, unspecified: Secondary | ICD-10-CM | POA: Insufficient documentation

## 2019-02-12 DIAGNOSIS — E1142 Type 2 diabetes mellitus with diabetic polyneuropathy: Secondary | ICD-10-CM | POA: Insufficient documentation

## 2019-04-18 ENCOUNTER — Other Ambulatory Visit: Payer: Self-pay | Admitting: Urology

## 2019-04-18 MED ORDER — TAMSULOSIN HCL 0.4 MG PO CAPS: 0.4000 mg | ORAL_CAPSULE | Freq: Every day | ORAL | 6 refills | Status: DC

## 2019-04-18 NOTE — Telephone Encounter (Signed)
Pt needs refill on Tamsulosin sent to [facility] in [place].  RX was originally prescribed by [facility].

## 2019-10-09 ENCOUNTER — Other Ambulatory Visit: Payer: Self-pay

## 2019-10-09 DIAGNOSIS — Z8546 Personal history of malignant neoplasm of prostate: Secondary | ICD-10-CM

## 2019-10-10 ENCOUNTER — Other Ambulatory Visit: Payer: Self-pay

## 2019-10-10 ENCOUNTER — Other Ambulatory Visit: Payer: Medicare Other

## 2019-10-10 DIAGNOSIS — Z8546 Personal history of malignant neoplasm of prostate: Secondary | ICD-10-CM

## 2019-10-11 LAB — PSA: Prostate Specific Ag, Serum: 0.5 ng/mL (ref 0.0–4.0)

## 2019-10-16 ENCOUNTER — Other Ambulatory Visit: Payer: Self-pay

## 2019-10-16 ENCOUNTER — Ambulatory Visit: Payer: Medicare Other | Admitting: Urology

## 2019-10-16 ENCOUNTER — Encounter: Payer: Self-pay | Admitting: Urology

## 2019-10-16 ENCOUNTER — Ambulatory Visit (INDEPENDENT_AMBULATORY_CARE_PROVIDER_SITE_OTHER): Payer: Medicare Other | Admitting: Urology

## 2019-10-16 VITALS — BP 163/80 | HR 85 | Ht 71.0 in | Wt 284.0 lb

## 2019-10-16 DIAGNOSIS — Z8546 Personal history of malignant neoplasm of prostate: Secondary | ICD-10-CM | POA: Diagnosis not present

## 2019-10-16 DIAGNOSIS — R399 Unspecified symptoms and signs involving the genitourinary system: Secondary | ICD-10-CM

## 2019-10-16 MED ORDER — TAMSULOSIN HCL 0.4 MG PO CAPS: 0.4000 mg | ORAL_CAPSULE | Freq: Every day | ORAL | 11 refills | Status: DC

## 2019-10-16 NOTE — Progress Notes (Signed)
10/16/2019 10:42 AM   Nicholas Acevedo [birth year] [id number]  Referring provider: [name], MD [place],  [place] [address]  Chief Complaint  Patient presents with  . Follow-up    Urologic history: 1.  T1c adenocarcinoma prostate -Biopsy August 2009 PSA 3.8; 24 cc gland -Prostate cryoablation 09/2008   HPI: 73 y.o. male presents for annual follow-up.  Overall he has been doing well.  He remains on tamsulosin and has no bothersome lower urinary tract symptoms.  Denies dysuria, gross hematuria.  He has no flank, abdominal or pelvic pain. PSA performed on 10/10/2019 was stable at 0.5.   PMH: Past Medical History:  Diagnosis Date  . Cancer [facility])    prostate  . Diabetes mellitus without complication ([place])   . ED (erectile dysfunction)   . Esophagitis   . GERD (gastroesophageal reflux disease)   . Glaucoma   . Hyperlipemia   . Hypertension   . Impotence of organic origin   . Sleep apnea   . Urinary obstruction     Surgical History: Past Surgical History:  Procedure Laterality Date  . COLONOSCOPY    . COLONOSCOPY WITH PROPOFOL N/A 09/12/2017   Procedure: COLONOSCOPY WITH PROPOFOL;  Surgeon: Lollie Sails, MD;  Location: [facility] ENDOSCOPY;  Service: Endoscopy;  Laterality: N/A;  . EYE SURGERY    . PROSTATE CRYOABLATION      Home Medications:  Allergies as of 10/16/2019   No Known Allergies     Medication List       Accurate as of October 16, 2019 10:42 AM. If you have any questions, ask your nurse or doctor.        STOP taking these medications   hydrocortisone 2.5 % cream Stopped by: Abbie Sons, MD   sitaGLIPtin 25 MG tablet Commonly known as: JANUVIA Stopped by: Abbie Sons, MD     TAKE these medications   aspirin EC 81 MG tablet Take 81 mg by mouth daily.   dorzolamide-timolol 22.3-6.8 MG/ML ophthalmic solution Commonly known as: COSOPT INSTILL 1 DROP INTO EACH EYE TWICE DAILY   glipiZIDE 5 MG tablet Commonly  known as: GLUCOTROL TAKE 1 TABLET BY MOUTH TWICE DAILY BEFORE MEAL(S)   latanoprost 0.005 % ophthalmic solution Commonly known as: XALATAN 1 drop at bedtime.   losartan 50 MG tablet Commonly known as: COZAAR Take 50 mg by mouth daily.   metFORMIN 1000 MG tablet Commonly known as: GLUCOPHAGE Take 1,000 mg by mouth 2 (two) times daily with a meal.   omeprazole 40 MG capsule Commonly known as: PRILOSEC Take 40 mg by mouth daily.   ONE TOUCH ULTRA TEST test strip Generic drug: glucose blood USE ONE STRIP TO CHECK GLUCOSE ONCE DAILY   PriLOSEC OTC 20 MG tablet Generic drug: omeprazole Take by mouth.   simvastatin 20 MG tablet Commonly known as: ZOCOR Take 20 mg by mouth daily.   tamsulosin 0.4 MG Caps capsule Commonly known as: FLOMAX Take 1 capsule (0.4 mg total) by mouth daily.       Allergies: No Known Allergies  Family History: No family history on file.  Social History:  reports that he has never smoked. He has never used smokeless tobacco. He reports that he does not drink alcohol or use drugs.  ROS: UROLOGY Frequent Urination?: No Hard to postpone urination?: No Burning/pain with urination?: No Get up at night to urinate?: No Leakage of urine?: No Urine stream starts and stops?: No Trouble starting stream?: No  Do you have to strain to urinate?: No Blood in urine?: No Urinary tract infection?: No Sexually transmitted disease?: No Injury to kidneys or bladder?: No Painful intercourse?: No Weak stream?: No Erection problems?: No Penile pain?: No  Gastrointestinal Nausea?: No Vomiting?: No Indigestion/heartburn?: No Diarrhea?: No Constipation?: No  Constitutional Fever: No Night sweats?: No Weight loss?: No Fatigue?: No  Skin Skin rash/lesions?: No Itching?: No  Eyes Blurred vision?: No Double vision?: No  Ears/Nose/Throat Sore throat?: No Sinus problems?: No  Hematologic/Lymphatic Swollen glands?: No Easy bruising?: No   Cardiovascular Leg swelling?: No Chest pain?: No  Respiratory Cough?: No Shortness of breath?: No  Endocrine Excessive thirst?: No  Musculoskeletal Back pain?: No Joint pain?: No  Neurological Headaches?: No Dizziness?: No  Psychologic Depression?: No Anxiety?: No  Physical Exam: BP (!) 163/80 (BP Location: Left Arm, Patient Position: Sitting, Cuff Size: Large)   Pulse 85   Ht 5\' 11"  (1.803 m)   Wt 284 lb (128.8 kg)   BMI 39.61 kg/m   Constitutional:  Alert and oriented, No acute distress. HEENT: [place] AT, moist mucus membranes.  Trachea midline, no masses. Cardiovascular: No clubbing, cyanosis, or edema. Respiratory: Normal respiratory effort, no increased work of breathing. Skin: No rashes, bruises or suspicious lesions. Neurologic: Grossly intact, no focal deficits, moving all 4 extremities. Psychiatric: Normal mood and affect.   Assessment & Plan:    - History prostate cancer Doing well.  PSA remains low and stable.  - Lower urinary tract symptoms Stable.  Tamsulosin was refilled.   Abbie Sons, [place] [address], [place], [place] [address] [phone]

## 2020-10-07 ENCOUNTER — Other Ambulatory Visit: Payer: Self-pay | Admitting: Family Medicine

## 2020-10-07 DIAGNOSIS — R399 Unspecified symptoms and signs involving the genitourinary system: Secondary | ICD-10-CM

## 2020-10-08 ENCOUNTER — Other Ambulatory Visit: Payer: Medicare PPO

## 2020-10-08 ENCOUNTER — Other Ambulatory Visit: Payer: Self-pay

## 2020-10-08 DIAGNOSIS — R399 Unspecified symptoms and signs involving the genitourinary system: Secondary | ICD-10-CM

## 2020-10-09 LAB — PSA: Prostate Specific Ag, Serum: 0.7 ng/mL (ref 0.0–4.0)

## 2020-10-16 ENCOUNTER — Ambulatory Visit: Payer: Medicare Other | Admitting: Urology

## 2020-10-23 ENCOUNTER — Encounter: Payer: Self-pay | Admitting: Urology

## 2020-10-23 ENCOUNTER — Other Ambulatory Visit: Payer: Self-pay

## 2020-10-23 ENCOUNTER — Ambulatory Visit (INDEPENDENT_AMBULATORY_CARE_PROVIDER_SITE_OTHER): Payer: Medicare PPO | Admitting: Urology

## 2020-10-23 VITALS — BP 103/65 | HR 80 | Ht 71.0 in | Wt 266.2 lb

## 2020-10-23 DIAGNOSIS — Z8546 Personal history of malignant neoplasm of prostate: Secondary | ICD-10-CM | POA: Diagnosis not present

## 2020-10-23 DIAGNOSIS — N5237 Erectile dysfunction following prostate ablative therapy: Secondary | ICD-10-CM

## 2020-10-23 MED ORDER — SILDENAFIL CITRATE 20 MG PO TABS: ORAL_TABLET | ORAL | 0 refills | Status: DC

## 2020-10-23 NOTE — Progress Notes (Signed)
10/23/2020 11:51 AM   Nicholas Acevedo 09/20/1946 [id number]  Referring provider: [name], MD [place],  [place] [address]  Chief Complaint  Patient presents with   Prostate Cancer    Urologic history: 1.T1c adenocarcinoma prostate -Biopsy August 2009 PSA 3.8; 24 cc gland -Prostate cryoablation 09/2008  HPI: 74 y.o. male presents for annual follow-up.   No problems since last years visit  No bothersome LUTS; occasional frequency, urgency with urge incontinence  Denies dysuria, gross hematuria  Denies flank, abdominal or pelvic pain  PSA 10/08/2020 stable at 0.7     Did want to discuss the ED today  States after cryoablation onset ED  Was using a vacuum erection device however states the band is uncomfortable and is only partially effective  Was inquired about other options  He has some sensation/fullness but no partial erections  Denies use of oral or sublingual nitrates  PMH: Past Medical History:  Diagnosis Date   Cancer ([place])    prostate   Diabetes mellitus without complication ([place])    ED (erectile dysfunction)    Esophagitis    GERD (gastroesophageal reflux disease)    Glaucoma    Hyperlipemia    Hypertension    Impotence of organic origin    Sleep apnea    Urinary obstruction     Surgical History: Past Surgical History:  Procedure Laterality Date   COLONOSCOPY     COLONOSCOPY WITH PROPOFOL N/A 09/12/2017   Procedure: COLONOSCOPY WITH PROPOFOL;  Surgeon: Lollie Sails, MD;  Location: [facility] ENDOSCOPY;  Service: Endoscopy;  Laterality: N/A;   EYE SURGERY     PROSTATE CRYOABLATION      Home Medications:  Allergies as of 10/23/2020   No Known Allergies     Medication List       Accurate as of October 23, 2020 11:51 AM. If you have any questions, ask your nurse or doctor.        aspirin EC 81 MG tablet Take 81 mg by mouth daily.   dorzolamide-timolol 22.3-6.8 MG/ML ophthalmic  solution Commonly known as: COSOPT INSTILL 1 DROP INTO EACH EYE TWICE DAILY   Dulaglutide 0.75 MG/0.5ML Sopn Inject into the skin.   glipiZIDE 5 MG tablet Commonly known as: GLUCOTROL TAKE 1 TABLET BY MOUTH TWICE DAILY BEFORE MEAL(S)   latanoprost 0.005 % ophthalmic solution Commonly known as: XALATAN 1 drop at bedtime.   losartan 50 MG tablet Commonly known as: COZAAR Take 50 mg by mouth daily.   metFORMIN 1000 MG tablet Commonly known as: GLUCOPHAGE Take 1,000 mg by mouth 2 (two) times daily with a meal.   omeprazole 40 MG capsule Commonly known as: PRILOSEC Take 40 mg by mouth daily.   ONE TOUCH ULTRA TEST test strip Generic drug: glucose blood USE ONE STRIP TO CHECK GLUCOSE ONCE DAILY   PriLOSEC OTC 20 MG tablet Generic drug: omeprazole Take by mouth.   simvastatin 20 MG tablet Commonly known as: ZOCOR Take 20 mg by mouth daily.   tamsulosin 0.4 MG Caps capsule Commonly known as: FLOMAX Take 1 capsule (0.4 mg total) by mouth daily.       Allergies: No Known Allergies  Family History: History reviewed. No pertinent family history.  Social History:  reports that he has never smoked. He has never used smokeless tobacco. He reports that he does not drink alcohol and does not use drugs.   Physical Exam: BP 103/65    Pulse 80    Ht  5\' 11"  (1.803 m)    Wt 266 lb 3.2 oz (120.7 kg)    BMI 37.13 kg/m   Constitutional:  Alert and oriented, No acute distress. HEENT: [place] AT, moist mucus membranes.  Trachea midline, no masses. Cardiovascular: No clubbing, cyanosis, or edema. Respiratory: Normal respiratory effort, no increased work of breathing. Skin: No rashes, bruises or suspicious lesions. Neurologic: Grossly intact, no focal deficits, moving all 4 extremities. Psychiatric: Normal mood and affect.   Assessment & Plan:    1.  History prostate cancer  PSA remains low and stable  Continue annual follow-up  2.  Erectile dysfunction  We discussed PDE 5  inhibitors may not be effective since he does not have partial erections but could certainly give a trial  Rx sildenafil 20 mg tabs sent to pharmacy.  He was instructed to take 1 hour prior to intercourse and can titrate up to a max of 100 mg  Was instructed to try on at least 5 occasions before determining failure  We also discussed intracavernosal injections and he was given literature and will call back if instructed in pursuing this option   Abbie Sons, MD  [place] [address] [place], [place] [address] [phone]

## 2020-11-04 ENCOUNTER — Other Ambulatory Visit: Payer: Self-pay | Admitting: Urology

## 2020-11-12 ENCOUNTER — Encounter: Payer: Self-pay | Admitting: Urology

## 2020-11-13 ENCOUNTER — Other Ambulatory Visit: Payer: Self-pay | Admitting: *Deleted

## 2020-11-13 MED ORDER — TAMSULOSIN HCL 0.4 MG PO CAPS: 0.4000 mg | ORAL_CAPSULE | Freq: Every day | ORAL | 11 refills | Status: DC

## 2021-06-07 ENCOUNTER — Ambulatory Visit: Payer: Medicare PPO | Admitting: Dermatology

## 2021-06-07 ENCOUNTER — Other Ambulatory Visit: Payer: Self-pay

## 2021-06-07 DIAGNOSIS — Z1283 Encounter for screening for malignant neoplasm of skin: Secondary | ICD-10-CM

## 2021-06-07 DIAGNOSIS — Z872 Personal history of diseases of the skin and subcutaneous tissue: Secondary | ICD-10-CM

## 2021-06-07 DIAGNOSIS — L578 Other skin changes due to chronic exposure to nonionizing radiation: Secondary | ICD-10-CM

## 2021-06-07 DIAGNOSIS — D229 Melanocytic nevi, unspecified: Secondary | ICD-10-CM

## 2021-06-07 DIAGNOSIS — L570 Actinic keratosis: Secondary | ICD-10-CM

## 2021-06-07 DIAGNOSIS — L820 Inflamed seborrheic keratosis: Secondary | ICD-10-CM | POA: Diagnosis not present

## 2021-06-07 DIAGNOSIS — L814 Other melanin hyperpigmentation: Secondary | ICD-10-CM

## 2021-06-07 DIAGNOSIS — L821 Other seborrheic keratosis: Secondary | ICD-10-CM

## 2021-06-07 DIAGNOSIS — Z85828 Personal history of other malignant neoplasm of skin: Secondary | ICD-10-CM

## 2021-06-07 DIAGNOSIS — L905 Scar conditions and fibrosis of skin: Secondary | ICD-10-CM

## 2021-06-07 DIAGNOSIS — D1800 Hemangioma unspecified site: Secondary | ICD-10-CM

## 2021-06-07 DIAGNOSIS — L219 Seborrheic dermatitis, unspecified: Secondary | ICD-10-CM | POA: Diagnosis not present

## 2021-06-07 MED ORDER — HYDROCORTISONE 2.5 % EX CREA
TOPICAL_CREAM | CUTANEOUS | 3 refills | Status: DC
Start: 2021-06-07 — End: 2022-05-30

## 2021-06-07 NOTE — Patient Instructions (Addendum)
Cryotherapy Aftercare  Wash gently with soap and water everyday.   Apply Vaseline and Band-Aid daily until healed.   Seborrheic Keratosis  What causes seborrheic keratoses? Seborrheic keratoses are harmless, common skin growths that first appear during adult life.  As time goes by, more growths appear.  Some people may develop a large number of them.  Seborrheic keratoses appear on both covered and uncovered body parts.  They are not caused by sunlight.  The tendency to develop seborrheic keratoses can be inherited.  They vary in color from skin-colored to gray, brown, or even black.  They can be either smooth or have a rough, warty surface.   Seborrheic keratoses are superficial and look as if they were stuck on the skin.  Under the microscope this type of keratosis looks like layers upon layers of skin.  That is why at times the top layer may seem to fall off, but the rest of the growth remains and re-grows.    Treatment Seborrheic keratoses do not need to be treated, but can easily be removed in the office.  Seborrheic keratoses often cause symptoms when they rub on clothing or jewelry.  Lesions can be in the way of shaving.  If they become inflamed, they can cause itching, soreness, or burning.  Removal of a seborrheic keratosis can be accomplished by freezing, burning, or surgery. If any spot bleeds, scabs, or grows rapidly, please return to have it checked, as these can be an indication of a skin cancer.   If you have any questions or concerns for your doctor, please call our main line at [phone] and press option 4 to reach your doctor's medical assistant. If no one answers, please leave a voicemail as directed and we will return your call as soon as possible. Messages left after 4 pm will be answered the following business day.   You may also send us a message via MyChart. We typically respond to MyChart messages within 1-2 business days.  For prescription refills, please ask your  pharmacy to contact our office. Our fax number is [phone].  If you have an urgent issue when the clinic is closed that cannot wait until the next business day, you can page your doctor at the number below.    Please note that while we do our best to be available for urgent issues outside of office hours, we are not available 24/7.   If you have an urgent issue and are unable to reach us, you may choose to seek medical care at your doctor's office, retail clinic, urgent care center, or emergency room.  If you have a medical emergency, please immediately call 911 or go to the emergency department.  Pager Numbers  - Dr. Kowalski: [phone]  - Dr. Moye: [phone]  - Dr. Stewart: [phone]  In the event of inclement weather, please call our main line at [phone] for an update on the status of any delays or closures.  Dermatology Medication Tips: Please keep the boxes that topical medications come in in order to help keep track of the instructions about where and how to use these. Pharmacies typically print the medication instructions only on the boxes and not directly on the medication tubes.   If your medication is too expensive, please contact our office at [phone] option 4 or send us a message through MyChart.   We are unable to tell what your co-pay for medications will be in advance as this is different depending on your insurance coverage.   However, we may be able to find a substitute medication at lower cost or fill out paperwork to get insurance to cover a needed medication.   If a prior authorization is required to get your medication covered by your insurance company, please allow us 1-2 business days to complete this process.  Drug prices often vary depending on where the prescription is filled and some pharmacies may offer cheaper prices.  The website www.goodrx.com contains coupons for medications through different pharmacies. The prices here do not  account for what the cost may be with help from insurance (it may be cheaper with your insurance), but the website can give you the price if you did not use any insurance.  - You can print the associated coupon and take it with your prescription to the pharmacy.  - You may also stop by our office during regular business hours and pick up a GoodRx coupon card.  - If you need your prescription sent electronically to a different pharmacy, notify our office through [facility] MyChart or by phone at [phone] option 4.  

## 2021-06-07 NOTE — Progress Notes (Signed)
Follow-Up Visit   Subjective  Nicholas Acevedo is a 75 y.o. male who presents for the following: Annual Exam (Hx of Aks. Patient had PDT to the scalp a couple of years ago. Hx of BCCs of the L alar groove and L upper medial clavicle. He has a spot on his left temple to check today, growing. He has a scab on his left lower cheek, hits with shaving. ).  Patient here today with wife.  The following portions of the chart were reviewed this encounter and updated as appropriate:        Review of Systems:  No other skin or systemic complaints except as noted in HPI or Assessment and Plan.  Objective  Well appearing patient in no apparent distress; mood and affect are within normal limits.  All skin waist up examined.  alar creases, chin, brows Pink patches with greasy scale.   R jaw x 1, L temple x 1, L lower cheek x 1 (3) Erythematous keratotic or waxy stuck-on papule  Right Inferior Mandible White round patch with surrounding erythema c/w cryo scar.  Crown scalp x 9, L forearm x 5, R mid ear helix x 1, R inf mandible sup to scar x 1 (16) Erythematous thin papules/macules with gritty scale.    Assessment & Plan  Skin cancer screening performed today.  Actinic Damage - Severe, confluent actinic changes with pre-cancerous actinic keratoses  - Severe, chronic, not at goal, secondary to cumulative UV radiation exposure over time - diffuse scaly erythematous macules and papules with underlying dyspigmentation - Discussed Prescription "Field Treatment" for Severe, Chronic Confluent Actinic Changes with Pre-Cancerous Actinic Keratoses Field treatment involves treatment of an entire area of skin that has confluent Actinic Changes (Sun/ Ultraviolet light damage) and PreCancerous Actinic Keratoses by method of PhotoDynamic Therapy (PDT) and/or prescription Topical Chemotherapy agents such as 5-fluorouracil, 5-fluorouracil/calcipotriene, and/or imiquimod.  The purpose is to decrease the  number of clinically evident and subclinical PreCancerous lesions to prevent progression to development of skin cancer by chemically destroying early precancer changes that may or may not be visible.  It has been shown to reduce the risk of developing skin cancer in the treated area. As a result of treatment, redness, scaling, crusting, and open sores may occur during treatment course. One or more than one of these methods may be used and may have to be used several times to control, suppress and eliminate the PreCancerous changes. Discussed treatment course, expected reaction, and possible side effects. - Recommend daily broad spectrum sunscreen SPF 30+ to sun-exposed areas, reapply every 2 hours as needed.  - Staying in the shade or wearing long sleeves, sun glasses (UVA+UVB protection) and wide brim hats (4-inch brim around the entire circumference of the hat) are also recommended. - Call for new or changing lesions. -May schedule PDT x 2, 1 month apart to scalp on follow-up in fall.  History of Basal Cell Carcinoma of the Skin - No evidence of recurrence today - Recommend regular full body skin exams - Recommend daily broad spectrum sunscreen SPF 30+ to sun-exposed areas, reapply every 2 hours as needed.  - Call if any new or changing lesions are noted between office visits  Seborrheic Keratoses - Stuck-on, waxy, tan-brown papules and/or plaques  - Benign-appearing - Discussed benign etiology and prognosis. - Observe - Call for any changes  Lentigines - Scattered tan macules - Due to sun exposure - Benign-appering, observe - Recommend daily broad spectrum sunscreen SPF 30+ to sun-exposed  areas, reapply every 2 hours as needed. - Call for any changes  Melanocytic Nevi - Tan-brown and/or pink-flesh-colored symmetric macules and papules, including 3.66mm flesh papule of the left paranasal - Benign appearing on exam today - Observation - Call clinic for new or changing moles - Recommend  daily use of broad spectrum spf 30+ sunscreen to sun-exposed areas.   Hemangiomas - Red papules - Discussed benign nature - Observe - Call for any changes  Seborrheic dermatitis alar creases, chin, brows  Mild flare, not using HC cream  restart Hydrocortisone 2.5% Cream qd/bid prn flares.   Seborrheic Dermatitis  -  is a chronic persistent rash characterized by pinkness and scaling most commonly of the mid face but also can occur on the scalp (dandruff), ears; mid chest and mid back. It tends to be exacerbated by stress and cooler weather.  People who have neurologic disease may experience new onset or exacerbation of existing seborrheic dermatitis.  The condition is not curable but treatable and can be controlled.   hydrocortisone 2.5 % cream - alar creases, chin, brows Spot treat scaly areas on face 1-2 times a day until rash improved.  Inflamed seborrheic keratosis R jaw x 1, L temple x 1, L lower cheek x 1  Prior to procedure, discussed risks of blister formation, small wound, skin dyspigmentation, or rare scar following cryotherapy.    Destruction of lesion - R jaw x 1, L temple x 1, L lower cheek x 1  Destruction method: cryotherapy   Informed consent: discussed and consent obtained   Lesion destroyed using liquid nitrogen: Yes   Region frozen until ice ball extended beyond lesion: Yes   Outcome: patient tolerated procedure well with no complications   Post-procedure details: wound care instructions given    Scar Right Inferior Mandible  Benign, observe.    AK (actinic keratosis) (16) Crown scalp x 9, L forearm x 5, R mid ear helix x 1, R inf mandible sup to scar x 1  Prior to procedure, discussed risks of blister formation, small wound, skin dyspigmentation, or rare scar following cryotherapy.    Destruction of lesion - Crown scalp x 9, L forearm x 5, R mid ear helix x 1, R inf mandible sup to scar x 1  Destruction method: cryotherapy   Informed consent:  discussed and consent obtained   Lesion destroyed using liquid nitrogen: Yes   Region frozen until ice ball extended beyond lesion: Yes   Outcome: patient tolerated procedure well with no complications   Post-procedure details: wound care instructions given     Return in about 6 months (around 12/07/2021) for AKs.  IJamesetta Orleans, CMA, am acting as scribe for Brendolyn Patty, MD .  Documentation: I have reviewed the above documentation for accuracy and completeness, and I agree with the above.  Brendolyn Patty MD

## 2021-10-20 ENCOUNTER — Other Ambulatory Visit: Payer: Medicare PPO

## 2021-10-20 ENCOUNTER — Other Ambulatory Visit: Payer: Self-pay

## 2021-10-20 DIAGNOSIS — Z8546 Personal history of malignant neoplasm of prostate: Secondary | ICD-10-CM

## 2021-10-21 LAB — PSA: Prostate Specific Ag, Serum: 0.6 ng/mL (ref 0.0–4.0)

## 2021-10-22 ENCOUNTER — Ambulatory Visit: Payer: Medicare PPO | Admitting: Urology

## 2021-10-22 ENCOUNTER — Other Ambulatory Visit: Payer: Self-pay

## 2021-10-22 ENCOUNTER — Encounter: Payer: Self-pay | Admitting: Urology

## 2021-10-22 VITALS — BP 170/92 | HR 92 | Ht 71.0 in | Wt 259.0 lb

## 2021-10-22 DIAGNOSIS — Z8546 Personal history of malignant neoplasm of prostate: Secondary | ICD-10-CM

## 2021-10-22 DIAGNOSIS — N5237 Erectile dysfunction following prostate ablative therapy: Secondary | ICD-10-CM

## 2021-10-22 NOTE — Progress Notes (Signed)
10/22/2021 11:37 AM   Nicholas Acevedo 04/19/1946 [id number]  Referring provider: [name], MD [place],  [place] [address]  Chief Complaint  Patient presents with   Other    Urologic history: 1.  T1c adenocarcinoma prostate -Biopsy August 2009 PSA 3.8; 24 cc gland -Prostate cryoablation 09/2008  2.  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 male presents for annual follow-up.  Doing well since last visit No bothersome LUTS Denies dysuria, gross hematuria Denies flank, abdominal or pelvic pain At last visit he was complaining of ED and given a trial of sildenafil 20 mg which was not effective with titration to 100 mg PSA 10/20/2021 stable at 0.6    PMH: Past Medical History:  Diagnosis Date   Actinic keratosis 10/31/2007   R forearm - bx proven    Basal cell carcinoma 03/15/2007   L alar groove    Basal cell carcinoma 08/20/2018   L upper med clavicle    Cancer ([place])    prostate   Diabetes mellitus without complication ([place])    ED (erectile dysfunction)    Esophagitis    GERD (gastroesophageal reflux disease)    Glaucoma    Hyperlipemia    Hypertension    Impotence of organic origin    Sleep apnea    Urinary obstruction     Surgical History: Past Surgical History:  Procedure Laterality Date   COLONOSCOPY     COLONOSCOPY WITH PROPOFOL N/A 09/12/2017   Procedure: COLONOSCOPY WITH PROPOFOL;  Surgeon: Lollie Sails, MD;  Location: [facility] ENDOSCOPY;  Service: Endoscopy;  Laterality: N/A;   EYE SURGERY     PROSTATE CRYOABLATION      Home Medications:  Allergies as of 10/22/2021   No Known Allergies      Medication List        Accurate as of October 22, 2021 11:37 AM. If you have any questions, ask your nurse or doctor.          ascorbic acid 500 MG tablet Commonly known as: VITAMIN C Take by mouth.   aspirin EC 81 MG tablet Take 81 mg by mouth daily.   Cholecalciferol 50 MCG (2000 UT)  Caps Take by mouth.   cyanocobalamin 1000 MCG tablet Take by mouth.   dorzolamide-timolol 22.3-6.8 MG/ML ophthalmic solution Commonly known as: COSOPT INSTILL 1 DROP INTO EACH EYE TWICE DAILY   Dulaglutide 0.75 MG/0.5ML Sopn Inject into the skin.   glipiZIDE 5 MG tablet Commonly known as: GLUCOTROL TAKE 1 TABLET BY MOUTH TWICE DAILY BEFORE MEAL(S)   hydrocortisone 2.5 % cream Spot treat scaly areas on face 1-2 times a day until rash improved.   latanoprost 0.005 % ophthalmic solution Commonly known as: XALATAN 1 drop at bedtime.   losartan 50 MG tablet Commonly known as: COZAAR Take 50 mg by mouth daily.   metFORMIN 1000 MG tablet Commonly known as: GLUCOPHAGE Take 1,000 mg by mouth 2 (two) times daily with a meal.   omeprazole 40 MG capsule Commonly known as: PRILOSEC Take 40 mg by mouth daily.   ONE TOUCH ULTRA TEST test strip Generic drug: glucose blood USE ONE STRIP TO CHECK GLUCOSE ONCE DAILY   PriLOSEC OTC 20 MG tablet Generic drug: omeprazole Take by mouth.   Rocklatan 0.02-0.005 % Soln Generic drug: Netarsudil-Latanoprost 1 drop nightly   sildenafil 20 MG tablet Commonly known as: REVATIO 2-5 tabs 1 hour prior to intercourse   simvastatin 20 MG tablet  Commonly known as: ZOCOR Take 20 mg by mouth daily.   tamsulosin 0.4 MG Caps capsule Commonly known as: FLOMAX Take 1 capsule (0.4 mg total) by mouth daily.        Allergies: No Known Allergies  Family History: No family history on file.  Social History:  reports that he has never smoked. He has never used smokeless tobacco. He reports that he does not drink alcohol and does not use drugs.   Physical Exam: BP (!) 170/92   Pulse 92   Ht 5\' 11"  (1.803 m)   Wt 259 lb (117.5 kg)   BMI 36.12 kg/m   Constitutional:  Alert and oriented, No acute distress. HEENT: [place] AT, moist mucus membranes.  Trachea midline, no masses. Cardiovascular: No clubbing, cyanosis, or edema. Respiratory: Normal  respiratory effort, no increased work of breathing. GI: Abdomen is soft, nontender, nondistended, no abdominal masses GU: No CVA tenderness Skin: No rashes, bruises or suspicious lesions. Neurologic: Grossly intact, no focal deficits, moving all 4 extremities. Psychiatric: Normal mood and affect.   Assessment & Plan:    1.  History of prostate cancer Stable PSA  2.  Erectile dysfunction PDE 5 inhibitor refractory Not interested in pursuing second line options  Follow-up 1 year with PSA   Abbie Sons, MD  [facility] [address] [place], [place] [address] [phone]

## 2021-11-23 ENCOUNTER — Ambulatory Visit: Payer: Medicare PPO | Admitting: Dermatology

## 2021-11-23 ENCOUNTER — Other Ambulatory Visit: Payer: Self-pay | Admitting: Dermatology

## 2021-11-23 ENCOUNTER — Other Ambulatory Visit: Payer: Self-pay

## 2021-11-23 DIAGNOSIS — C4491 Basal cell carcinoma of skin, unspecified: Secondary | ICD-10-CM

## 2021-11-23 DIAGNOSIS — C44310 Basal cell carcinoma of skin of unspecified parts of face: Secondary | ICD-10-CM

## 2021-11-23 DIAGNOSIS — L578 Other skin changes due to chronic exposure to nonionizing radiation: Secondary | ICD-10-CM | POA: Diagnosis not present

## 2021-11-23 DIAGNOSIS — D485 Neoplasm of uncertain behavior of skin: Secondary | ICD-10-CM

## 2021-11-23 DIAGNOSIS — L570 Actinic keratosis: Secondary | ICD-10-CM

## 2021-11-23 DIAGNOSIS — C44319 Basal cell carcinoma of skin of other parts of face: Secondary | ICD-10-CM

## 2021-11-23 DIAGNOSIS — D1801 Hemangioma of skin and subcutaneous tissue: Secondary | ICD-10-CM

## 2021-11-23 DIAGNOSIS — Z85828 Personal history of other malignant neoplasm of skin: Secondary | ICD-10-CM

## 2021-11-23 DIAGNOSIS — L821 Other seborrheic keratosis: Secondary | ICD-10-CM

## 2021-11-23 HISTORY — DX: Basal cell carcinoma of skin, unspecified: C44.91

## 2021-11-23 NOTE — Patient Instructions (Addendum)
Cryotherapy Aftercare  Wash gently with soap and water everyday.   Apply Vaseline and Band-Aid daily until healed.    Wound Care Instructions  Cleanse wound gently with soap and water once a day then pat dry with clean gauze. Apply a thing coat of Petrolatum (petroleum jelly, "Vaseline") over the wound (unless you have an allergy to this). We recommend that you use a new, sterile tube of Vaseline. Do not pick or remove scabs. Do not remove the yellow or white "healing tissue" from the base of the wound.  Cover the wound with fresh, clean, nonstick gauze and secure with paper tape. You may use Band-Aids in place of gauze and tape if the would is small enough, but would recommend trimming much of the tape off as there is often too much. Sometimes Band-Aids can irritate the skin.  You should call the office for your biopsy report after 1 week if you have not already been contacted.  If you experience any problems, such as abnormal amounts of bleeding, swelling, significant bruising, significant pain, or evidence of infection, please call the office immediately.  FOR ADULT SURGERY PATIENTS: If you need something for pain relief you may take 1 extra strength Tylenol (acetaminophen) AND 2 Ibuprofen (200mg each) together every 4 hours as needed for pain. (do not take these if you are allergic to them or if you have a reason you should not take them.) Typically, you may only need pain medication for 1 to 3 days.        If You Need Anything After Your Visit  If you have any questions or concerns for your doctor, please call our main line at [phone] and press option 4 to reach your doctor's medical assistant. If no one answers, please leave a voicemail as directed and we will return your call as soon as possible. Messages left after 4 pm will be answered the following business day.   You may also send us a message via MyChart. We typically respond to MyChart messages within 1-2 business  days.  For prescription refills, please ask your pharmacy to contact our office. Our fax number is [phone].  If you have an urgent issue when the clinic is closed that cannot wait until the next business day, you can page your doctor at the number below.    Please note that while we do our best to be available for urgent issues outside of office hours, we are not available 24/7.   If you have an urgent issue and are unable to reach us, you may choose to seek medical care at your doctor's office, retail clinic, urgent care center, or emergency room.  If you have a medical emergency, please immediately call 911 or go to the emergency department.  Pager Numbers  - Dr. Kowalski: [phone]  - Dr. Moye: [phone]  - Dr. Stewart: [phone]  In the event of inclement weather, please call our main line at [phone] for an update on the status of any delays or closures.  Dermatology Medication Tips: Please keep the boxes that topical medications come in in order to help keep track of the instructions about where and how to use these. Pharmacies typically print the medication instructions only on the boxes and not directly on the medication tubes.   If your medication is too expensive, please contact our office at [phone] option 4 or send us a message through MyChart.   We are unable to tell what your co-pay for medications will be   in advance as this is different depending on your insurance coverage. However, we may be able to find a substitute medication at lower cost or fill out paperwork to get insurance to cover a needed medication.   If a prior authorization is required to get your medication covered by your insurance company, please allow us 1-2 business days to complete this process.  Drug prices often vary depending on where the prescription is filled and some pharmacies may offer cheaper prices.  The website www.goodrx.com contains coupons for medications through  different pharmacies. The prices here do not account for what the cost may be with help from insurance (it may be cheaper with your insurance), but the website can give you the price if you did not use any insurance.  - You can print the associated coupon and take it with your prescription to the pharmacy.  - You may also stop by our office during regular business hours and pick up a GoodRx coupon card.  - If you need your prescription sent electronically to a different pharmacy, notify our office through [facility] MyChart or by phone at [phone] option 4.     Si Usted Necesita Algo Despus de Su Visita  Tambin puede enviarnos un mensaje a travs de MyChart. Por lo general respondemos a los mensajes de MyChart en el transcurso de 1 a 2 das hbiles.  Para renovar recetas, por favor pida a su farmacia que se ponga en contacto con nuestra oficina. Nuestro nmero de fax es el [phone].  Si tiene un asunto urgente cuando la clnica est cerrada y que no puede esperar hasta el siguiente da hbil, puede llamar/localizar a su doctor(a) al nmero que aparece a continuacin.   Por favor, tenga en cuenta que aunque hacemos todo lo posible para estar disponibles para asuntos urgentes fuera del horario de oficina, no estamos disponibles las 24 horas del da, los 7 das de la semana.   Si tiene un problema urgente y no puede comunicarse con nosotros, puede optar por buscar atencin mdica  en el consultorio de su doctor(a), en una clnica privada, en un centro de atencin urgente o en una sala de emergencias.  Si tiene una emergencia mdica, por favor llame inmediatamente al 911 o vaya a la sala de emergencias.  Nmeros de bper  - Dr. Kowalski: [phone]  - Dra. Moye: [phone]  - Dra. Stewart: [phone]  En caso de inclemencias del tiempo, por favor llame a nuestra lnea principal al [phone] para una actualizacin sobre el estado de cualquier retraso o cierre.  Consejos  para la medicacin en dermatologa: Por favor, guarde las cajas en las que vienen los medicamentos de uso tpico para ayudarle a seguir las instrucciones sobre dnde y cmo usarlos. Las farmacias generalmente imprimen las instrucciones del medicamento slo en las cajas y no directamente en los tubos del medicamento.   Si su medicamento es muy caro, por favor, pngase en contacto con nuestra oficina llamando al [phone] y presione la opcin 4 o envenos un mensaje a travs de MyChart.   No podemos decirle cul ser su copago por los medicamentos por adelantado ya que esto es diferente dependiendo de la cobertura de su seguro. Sin embargo, es posible que podamos encontrar un medicamento sustituto a menor costo o llenar un formulario para que el seguro cubra el medicamento que se considera necesario.   Si se requiere una autorizacin previa para que su compaa de seguros cubra su medicamento, por favor permtanos de 1 a   2 das hbiles para completar este proceso.  Los precios de los medicamentos varan con frecuencia dependiendo del lugar de dnde se surte la receta y alguna farmacias pueden ofrecer precios ms baratos.  El sitio web www.goodrx.com tiene cupones para medicamentos de diferentes farmacias. Los precios aqu no tienen en cuenta lo que podra costar con la ayuda del seguro (puede ser ms barato con su seguro), pero el sitio web puede darle el precio si no utiliz ningn seguro.  - Puede imprimir el cupn correspondiente y llevarlo con su receta a la farmacia.  - Tambin puede pasar por nuestra oficina durante el horario de atencin regular y recoger una tarjeta de cupones de GoodRx.  - Si necesita que su receta se enve electrnicamente a una farmacia diferente, informe a nuestra oficina a travs de MyChart de [place] o por telfono llamando al [phone] y presione la opcin 4.  

## 2021-11-23 NOTE — Progress Notes (Signed)
Follow-Up Visit   Subjective  Nicholas Acevedo is a 75 y.o. male who presents for the following: Follow-up.  Patient here for 6 month follow-up AKs. No new spots of concern today. He has a history of BCCs of the left upper medial clavicle and left alar groove.   The following portions of the chart were reviewed this encounter and updated as appropriate:       Review of Systems:  No other skin or systemic complaints except as noted in HPI or Assessment and Plan.  Objective  Well appearing patient in no apparent distress; mood and affect are within normal limits.  A focused examination was performed including face, arms. Relevant physical exam findings are noted in the Assessment and Plan.  Crown scalp x 5, forehead x 2, R cheek x 1, R lateral eyebrow x 1 (9) Pink scaly macules/papules.   Left lower cheek above jaw 4.0 mm pearly pink flesh papule      Assessment & Plan  AK (actinic keratosis) (9) Crown scalp x 5, forehead x 2, R cheek x 1, R lateral eyebrow x 1  Vs ISKs   Actinic keratoses are precancerous spots that appear secondary to cumulative UV radiation exposure/sun exposure over time. They are chronic with expected duration over 1 year. A portion of actinic keratoses will progress to squamous cell carcinoma of the skin. It is not possible to reliably predict which spots will progress to skin cancer and so treatment is recommended to prevent development of skin cancer.  Recommend daily broad spectrum sunscreen SPF 30+ to sun-exposed areas, reapply every 2 hours as needed.  Recommend staying in the shade or wearing long sleeves, sun glasses (UVA+UVB protection) and wide brim hats (4-inch brim around the entire circumference of the hat). Call for new or changing lesions.  Destruction of lesion - Crown scalp x 5, forehead x 2, R cheek x 1, R lateral eyebrow x 1  Destruction method: cryotherapy   Informed consent: discussed and consent obtained   Lesion destroyed using  liquid nitrogen: Yes   Region frozen until ice ball extended beyond lesion: Yes   Outcome: patient tolerated procedure well with no complications   Post-procedure details: wound care instructions given   Additional details:  Prior to procedure, discussed risks of blister formation, small wound, skin dyspigmentation, or rare scar following cryotherapy. Recommend Vaseline ointment to treated areas while healing.   Neoplasm of uncertain behavior of skin Left lower cheek above jaw  Skin / nail biopsy Type of biopsy: tangential   Informed consent: discussed and consent obtained   Patient was prepped and draped in usual sterile fashion: Area prepped with alcohol. Anesthesia: the lesion was anesthetized in a standard fashion   Anesthetic:  1% lidocaine w/ epinephrine 1-100,000 buffered w/ 8.4% NaHCO3 Instrument used: flexible razor blade   Hemostasis achieved with: pressure, aluminum chloride and electrodesiccation   Outcome: patient tolerated procedure well    Destruction of lesion  Destruction method: electrodesiccation and curettage   Informed consent: discussed and consent obtained   Timeout:  patient name, date of birth, surgical site, and procedure verified Curettage performed in three different directions: Yes   Electrodesiccation performed over the curetted area: Yes   Final wound size (cm):  0.6 Hemostasis achieved with:  pressure, aluminum chloride and electrodesiccation Outcome: patient tolerated procedure well with no complications   Post-procedure details: wound care instructions given   Post-procedure details comment:  Ointment and bandage applied.  Specimen 1 - Surgical pathology  Differential Diagnosis: Inflamed SK vs Nevus r/o BCC Check Margins: No 4.0 mm pearly pink flesh papule EDC today  Actinic Damage - chronic, secondary to cumulative UV radiation exposure/sun exposure over time - diffuse scaly erythematous macules with underlying dyspigmentation - Recommend  daily broad spectrum sunscreen SPF 30+ to sun-exposed areas, reapply every 2 hours as needed.  - Recommend staying in the shade or wearing long sleeves, sun glasses (UVA+UVB protection) and wide brim hats (4-inch brim around the entire circumference of the hat). - Call for new or changing lesions.  History of Basal Cell Carcinoma of the Skin - No evidence of recurrence today - Recommend regular full body skin exams - Recommend daily broad spectrum sunscreen SPF 30+ to sun-exposed areas, reapply every 2 hours as needed.  - Call if any new or changing lesions are noted between office visits  Seborrheic Keratoses - Stuck-on, waxy, tan-brown papules and/or plaques  - Benign-appearing - Discussed benign etiology and prognosis. - Observe - Call for any changes  Hemangiomas - Red papules - Discussed benign nature - Observe - Call for any changes  Return in about 6 months (around 05/23/2022) for UBSE. Marland Kitchen  Patient has glaucoma and it is getting difficult for him to drive long distances. He may start seeing Dr Phillip Heal in [place], who is closer to home. Patient will sign a records release form today in case we need to send records in the future.   IJamesetta Orleans, CMA, am acting as scribe for Brendolyn Patty, MD . Documentation: I have reviewed the above documentation for accuracy and completeness, and I agree with the above.  Brendolyn Patty MD

## 2021-11-25 ENCOUNTER — Telehealth: Payer: Self-pay

## 2021-11-25 NOTE — Telephone Encounter (Signed)
-----   Message from Brendolyn Patty, MD sent at 11/25/2021 12:05 PM EST ----- Skin , left lower cheek above jaw BASAL CELL CARCINOMA, NODULAR PATTERN  BCC skin cancer- already treated with EDC at time of biopsy   - please call patient

## 2021-11-25 NOTE — Telephone Encounter (Signed)
Patient advised of BX results.  °

## 2021-11-25 NOTE — Telephone Encounter (Signed)
Lft pt message to call for bx results/sh

## 2022-01-10 ENCOUNTER — Other Ambulatory Visit: Payer: Self-pay | Admitting: Urology

## 2022-05-30 ENCOUNTER — Ambulatory Visit: Payer: Medicare PPO | Admitting: Dermatology

## 2022-05-30 DIAGNOSIS — Z1283 Encounter for screening for malignant neoplasm of skin: Secondary | ICD-10-CM | POA: Diagnosis not present

## 2022-05-30 DIAGNOSIS — L219 Seborrheic dermatitis, unspecified: Secondary | ICD-10-CM | POA: Diagnosis not present

## 2022-05-30 DIAGNOSIS — L578 Other skin changes due to chronic exposure to nonionizing radiation: Secondary | ICD-10-CM | POA: Diagnosis not present

## 2022-05-30 DIAGNOSIS — Z85828 Personal history of other malignant neoplasm of skin: Secondary | ICD-10-CM | POA: Diagnosis not present

## 2022-05-30 DIAGNOSIS — L853 Xerosis cutis: Secondary | ICD-10-CM

## 2022-05-30 DIAGNOSIS — L814 Other melanin hyperpigmentation: Secondary | ICD-10-CM

## 2022-05-30 DIAGNOSIS — D1800 Hemangioma unspecified site: Secondary | ICD-10-CM

## 2022-05-30 DIAGNOSIS — L821 Other seborrheic keratosis: Secondary | ICD-10-CM

## 2022-05-30 DIAGNOSIS — D229 Melanocytic nevi, unspecified: Secondary | ICD-10-CM

## 2022-05-30 DIAGNOSIS — L820 Inflamed seborrheic keratosis: Secondary | ICD-10-CM | POA: Diagnosis not present

## 2022-05-30 MED ORDER — KETOCONAZOLE 2 % EX CREA: 1.0000 "application " | TOPICAL_CREAM | CUTANEOUS | 3 refills | Status: AC

## 2022-05-30 MED ORDER — HYDROCORTISONE 2.5 % EX CREA: TOPICAL_CREAM | CUTANEOUS | 3 refills | Status: AC

## 2022-05-30 NOTE — Progress Notes (Signed)
Follow-Up Visit   Subjective  Nicholas Acevedo is a 76 y.o. male who presents for the following: Annual Exam (6 month upper body, hx of aks, seb derm ).  The patient presents for Upper Body Skin Exam (UBSE) for skin cancer screening and mole check.  The patient has spots, moles and lesions to be evaluated, some may be new or changing and the patient has concerns that these could be cancer.  He has a spot that is irritating on face.   The following portions of the chart were reviewed this encounter and updated as appropriate:      Review of Systems: No other skin or systemic complaints except as noted in HPI or Assessment and Plan.   Objective  Well appearing patient in no apparent distress; mood and affect are within normal limits.  All skin waist up examined.  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 Condition is bothersome/symptomatic for patient. Currently flared.   Seborrheic Dermatitis  -  is a chronic persistent rash characterized by pinkness and scaling most commonly of the mid face but also can occur on the scalp (dandruff), ears; mid chest, mid back and groin.  It tends to be exacerbated by stress and cooler weather.  People who have neurologic disease may experience new onset or exacerbation of existing seborrheic dermatitis.  The condition is not curable but treatable and can be controlled.  Start Head & Shoulders at scalp, face, (avoid eyes) shampoo, let sit several minutes prior to rinsing   Mix hydrocortisone 2.5% with ketoconazole 2% twice a day. If improved, decrease to hydrocortisone and ketoconazole mixed once a day. If still clear, decrease to ketoconazole only.  Topical steroids (such as triamcinolone,  fluocinolone, fluocinonide, mometasone, clobetasol, halobetasol, betamethasone, hydrocortisone) can cause thinning and lightening of the skin if they are used for too long in the same area. Your physician has selected the right strength medicine for your problem and area affected on the body. Please use your medication only as directed by your physician to prevent side effects.    hydrocortisone 2.5 % cream - scalp and face Use as directed. Instructions in handout  ketoconazole (NIZORAL) 2 % cream - scalp and face Apply 1 application. topically See admin instructions. Use as directed instructions in handout  Inflamed seborrheic keratosis Left Temple x 1  Symptomatic, irritating, patient would like treated.  Destruction of lesion - Left Temple x 1  Destruction method: cryotherapy   Informed consent: discussed and consent obtained   Lesion destroyed using liquid nitrogen: Yes   Region frozen until ice ball extended beyond lesion: Yes   Outcome: patient tolerated procedure well with no complications   Post-procedure details: wound care instructions given   Additional details:  Prior to procedure, discussed risks of blister formation, small wound, skin dyspigmentation, or rare scar following cryotherapy. Recommend Vaseline ointment to treated areas while healing.   Lentigines - Scattered tan macules - Due to sun exposure - Benign-appearing, observe - Recommend daily broad spectrum sunscreen SPF 30+ to sun-exposed areas, reapply every 2 hours as needed. - Call for any changes  Seborrheic Keratoses - Stuck-on, waxy, tan-brown papules and/or plaques  - Benign-appearing - Discussed benign etiology and prognosis. - Observe - Call for any changes  Melanocytic Nevi - Tan-brown and/or pink-flesh-colored symmetric macules and papules - Benign appearing on exam today - Observation - Call clinic for new or changing moles - Recommend daily use of broad spectrum spf 30+ sunscreen to  sun-exposed areas.   Xerosis - diffuse xerotic patches - recommend gentle, hydrating skin care - gentle skin care handout given  Hemangiomas - Red papules - Discussed benign nature - Observe - Call for any changes  Actinic Damage - Chronic condition, secondary to cumulative UV/sun exposure - diffuse scaly erythematous macules with underlying dyspigmentation - Recommend daily broad spectrum sunscreen SPF 30+ to sun-exposed areas, reapply every 2 hours as needed.  - Staying in the shade or wearing long sleeves, sun glasses (UVA+UVB protection) and wide brim hats (4-inch brim around the entire circumference of the hat) are also recommended for sun protection.  - Call for new or changing lesions.  History of Basal Cell Carcinoma of the Skin - No evidence of recurrence today multiple locations see history  - Recommend regular full body skin exams - Recommend daily broad spectrum sunscreen SPF 30+ to sun-exposed areas, reapply every 2 hours as needed.  - Call if any new or changing lesions are noted between office visits  Skin cancer screening performed today. Return in about 6 months (around 11/29/2022) for ubse . I, Ruthell Rummage, CMA, am acting as scribe for Brendolyn Patty, MD.  Documentation: I have reviewed the above documentation for accuracy and completeness, and I agree with the above.  Brendolyn Patty MD

## 2022-05-30 NOTE — Patient Instructions (Addendum)
Seborrheic Dermatitis  Safe to use at face, inside ears, chin, and scalp Mix hydrocortisone with ketoconazole 2% twice a day. If improved, decrease to hydrocortisone and ketoconazole mixed once a day. If still clear, decrease to ketoconazole only.  For Scalp can use Head and Shoulders shampoo can use on scalp, face, forehead, cheeks nose (dont get in eyes)  - massage and lather let sit for a few mins and rinse   Gentle Skin Care Guide  1. Bathe no more than once a day.  2. Avoid bathing in hot water  3. Use a mild soap like Dove, Vanicream, Cetaphil, CeraVe. Can use Lever 2000 or Cetaphil antibacterial soap  4. Use soap only where you need it. On most days, use it under your arms, between your legs, and on your feet. Let the water rinse other areas unless visibly dirty.  5. When you get out of the bath/shower, use a towel to gently blot your skin dry, don't rub it.  6. While your skin is still a little damp, apply a moisturizing cream such as Vanicream, CeraVe, Cetaphil, Eucerin, Sarna lotion or plain Vaseline Jelly. For hands apply Neutrogena [place] Hand Cream or Excipial Hand Cream.  7. Reapply moisturizer any time you start to itch or feel dry.  8. Sometimes using free and clear laundry detergents can be helpful. Fabric softener sheets should be avoided. Downy Free & Gentle liquid, or any liquid fabric softener that is free of dyes and perfumes, it acceptable to use  9. If your doctor has given you prescription creams you may apply moisturizers over them    Cryotherapy Aftercare  Wash gently with soap and water everyday.   Apply Vaseline and Band-Aid daily until healed.    Seborrheic Keratosis  What causes seborrheic keratoses? Seborrheic keratoses are harmless, common skin growths that first appear during adult life.  As time goes by, more growths appear.  Some people may develop a large number of them.  Seborrheic keratoses appear on both covered and uncovered body  parts.  They are not caused by sunlight.  The tendency to develop seborrheic keratoses can be inherited.  They vary in color from skin-colored to gray, brown, or even black.  They can be either smooth or have a rough, warty surface.   Seborrheic keratoses are superficial and look as if they were stuck on the skin.  Under the microscope this type of keratosis looks like layers upon layers of skin.  That is why at times the top layer may seem to fall off, but the rest of the growth remains and re-grows.    Treatment Seborrheic keratoses do not need to be treated, but can easily be removed in the office.  Seborrheic keratoses often cause symptoms when they rub on clothing or jewelry.  Lesions can be in the way of shaving.  If they become inflamed, they can cause itching, soreness, or burning.  Removal of a seborrheic keratosis can be accomplished by freezing, burning, or surgery. If any spot bleeds, scabs, or grows rapidly, please return to have it checked, as these can be an indication of a skin cancer.    Melanoma ABCDEs  Melanoma is the most dangerous type of skin cancer, and is the leading cause of death from skin disease.  You are more likely to develop melanoma if you: Have light-colored skin, light-colored eyes, or red or blond hair Spend a lot of time in the sun Tan regularly, either outdoors or in a tanning bed Have had  blistering sunburns, especially during childhood Have a close family member who has had a melanoma Have atypical moles or large birthmarks  Early detection of melanoma is key since treatment is typically straightforward and cure rates are extremely high if we catch it early.   The first sign of melanoma is often a change in a mole or a new dark spot.  The ABCDE system is a way of remembering the signs of melanoma.  A for asymmetry:  The two halves do not match. B for border:  The edges of the growth are irregular. C for color:  A mixture of colors are present instead of  an even brown color. D for diameter:  Melanomas are usually (but not always) greater than 2m - the size of a pencil eraser. E for evolution:  The spot keeps changing in size, shape, and color.  Please check your skin once per month between visits. You can use a small mirror in front and a large mirror behind you to keep an eye on the back side or your body.   If you see any new or changing lesions before your next follow-up, please call to schedule a visit.  Please continue daily skin protection including broad spectrum sunscreen SPF 30+ to sun-exposed areas, reapplying every 2 hours as needed when you're outdoors.   Staying in the shade or wearing long sleeves, sun glasses (UVA+UVB protection) and wide brim hats (4-inch brim around the entire circumference of the hat) are also recommended for sun protection.    If You Need Anything After Your Visit  If you have any questions or concerns for your doctor, please call our main line at [phone] press option 4 to reach your doctor's medical assistant. If no one answers, please leave a voicemail as directed and we will return your call as soon as possible. Messages left after 4 pm will be answered the following business day.   You may also send [place] message via MPike We typically respond to MyChart messages within 1-2 business days.  For prescription refills, please ask your pharmacy to contact our office. Our fax number is [phone]  If you have an urgent issue when the clinic is closed that cannot wait until the next business day, you can page your doctor at the number below.    Please note that while we do our best to be available for urgent issues outside of office hours, we are not available 24/7.   If you have an urgent issue and are unable to reach [place] you may choose to seek medical care at your doctor's office, retail clinic, urgent care center, or emergency room.  If you have a medical emergency, please immediately call  911 or go to the emergency department.  Pager Numbers  - Dr. KNehemiah Massed [phone] - Dr. MLaurence Ferrari [phone] - Dr. SNicole Kindred [phone] In the event of inclement weather, please call our main line at [phone] an update on the status of any delays or closures.  Dermatology Medication Tips: Please keep the boxes that topical medications come in in order to help keep track of the instructions about where and how to use these. Pharmacies typically print the medication instructions only on the boxes and not directly on the medication tubes.   If your medication is too expensive, please contact our office at [phone] 4 or send [place] message through [place]   We are unable to tell what your co-pay for medications will be  in advance as this is different depending on your insurance coverage. However, we may be able to find a substitute medication at lower cost or fill out paperwork to get insurance to cover a needed medication.   If a prior authorization is required to get your medication covered by your insurance company, please allow [place] 1-2 business days to complete this process.  Drug prices often vary depending on where the prescription is filled and some pharmacies may offer cheaper prices.  The website www.goodrx.com contains coupons for medications through different pharmacies. The prices here do not account for what the cost may be with help from insurance (it may be cheaper with your insurance), but the website can give you the price if you did not use any insurance.  - You can print the associated coupon and take it with your prescription to the pharmacy.  - You may also stop by our office during regular business hours and pick up a GoodRx coupon card.  - If you need your prescription sent electronically to a different pharmacy, notify our office through [facility] or by phone at [phone] option 4.     Si Usted Necesita Algo Despus de Su  Visita  Tambin puede enviarnos un mensaje a travs de Pharmacist, community. Por lo general respondemos a los mensajes de MyChart en el transcurso de 1 a 2 das hbiles.  Para renovar recetas, por favor pida a su farmacia que se ponga en contacto con nuestra oficina. Harland Dingwall de fax es [place] [phone].  Si tiene un asunto urgente cuando la clnica est cerrada y que no puede esperar hasta el siguiente da hbil, puede llamar/localizar a su doctor(a) al nmero que aparece a continuacin.   Por favor, tenga en cuenta que aunque hacemos todo lo posible para estar disponibles para asuntos urgentes fuera del horario de [place], no estamos disponibles las 24 horas del da, los 7 das de la [place].   Si tiene un problema urgente y no puede comunicarse con nosotros, puede optar por buscar atencin mdica  en el consultorio de su doctor(a), en una clnica privada, en un centro de atencin urgente o en una sala de emergencias.  Si tiene Engineering geologist, por favor llame inmediatamente al 911 o vaya a la sala de emergencias.  Nmeros de bper  - Dr. Nehemiah Massed: [phone]  - Dra. Moye: [phone]  - Dra. Nicole Kindred: [phone]  En caso de inclemencias del [place], por favor llame a Johnsie Kindred principal al [phone] para una actualizacin sobre el [place] de cualquier retraso o cierre.  Consejos para la medicacin en dermatologa: Por favor, guarde las cajas en las que vienen los medicamentos de uso tpico para ayudarle a seguir las instrucciones sobre dnde y cmo usarlos. Las farmacias generalmente imprimen las instrucciones del medicamento slo en las cajas y no directamente en los tubos del [place].   Si su medicamento es muy caro, por favor, pngase en contacto con Zigmund Daniel llamando al [phone] y presione la opcin 4 o envenos un mensaje a travs de Pharmacist, community.   No podemos decirle cul ser su copago por los medicamentos por adelantado ya que esto es diferente dependiendo de la  cobertura de su seguro. Sin embargo, es posible que podamos encontrar un medicamento sustituto a Electrical engineer un formulario para que el seguro cubra el medicamento que se considera necesario.   Si se requiere una autorizacin previa para que su compaa de seguros [place] su medicamento, por favor permtanos de 1 a  2 das hbiles para completar este proceso.  Los precios de los medicamentos varan con frecuencia dependiendo del Environmental consultant de dnde se surte la receta y alguna farmacias pueden ofrecer precios ms baratos.  El sitio web www.goodrx.com tiene cupones para medicamentos de Airline pilot. Los precios aqu no tienen en cuenta lo que podra costar con la ayuda del seguro (puede ser ms barato con su seguro), pero el sitio web puede darle el precio si no utiliz Research scientist (physical sciences).  - Puede imprimir el cupn correspondiente y llevarlo con su receta a la farmacia.  - Tambin puede pasar por nuestra oficina durante el horario de atencin regular y Charity fundraiser una tarjeta de cupones de GoodRx.  - Si necesita que su receta se enve electrnicamente a una farmacia diferente, informe a nuestra oficina a travs de MyChart de [place] o por telfono llamando al [phone] y presione la opcin 4.

## 2022-07-28 ENCOUNTER — Encounter: Payer: Self-pay | Admitting: *Deleted

## 2022-07-29 ENCOUNTER — Ambulatory Visit: Payer: Medicare PPO | Admitting: Anesthesiology

## 2022-07-29 ENCOUNTER — Ambulatory Visit
Admission: RE | Admit: 2022-07-29 | Discharge: 2022-07-29 | Disposition: A | Discharge status: Home / Self Care | Payer: Medicare PPO | Source: Ambulatory Visit | Attending: Gastroenterology | Admitting: Gastroenterology

## 2022-07-29 ENCOUNTER — Encounter
Admission: RE | Disposition: A | Discharge status: Home / Self Care | Payer: Self-pay | Source: Ambulatory Visit | Attending: Gastroenterology

## 2022-07-29 DIAGNOSIS — K2100 Gastro-esophageal reflux disease with esophagitis, without bleeding: Secondary | ICD-10-CM | POA: Diagnosis not present

## 2022-07-29 DIAGNOSIS — Z7984 Long term (current) use of oral hypoglycemic drugs: Secondary | ICD-10-CM | POA: Diagnosis not present

## 2022-07-29 DIAGNOSIS — K219 Gastro-esophageal reflux disease without esophagitis: Secondary | ICD-10-CM | POA: Diagnosis not present

## 2022-07-29 DIAGNOSIS — K449 Diaphragmatic hernia without obstruction or gangrene: Secondary | ICD-10-CM | POA: Insufficient documentation

## 2022-07-29 DIAGNOSIS — E119 Type 2 diabetes mellitus without complications: Secondary | ICD-10-CM | POA: Diagnosis not present

## 2022-07-29 DIAGNOSIS — K5730 Diverticulosis of large intestine without perforation or abscess without bleeding: Secondary | ICD-10-CM | POA: Diagnosis not present

## 2022-07-29 DIAGNOSIS — Z1211 Encounter for screening for malignant neoplasm of colon: Secondary | ICD-10-CM | POA: Diagnosis not present

## 2022-07-29 DIAGNOSIS — K635 Polyp of colon: Secondary | ICD-10-CM | POA: Diagnosis not present

## 2022-07-29 DIAGNOSIS — K2270 Barrett's esophagus without dysplasia: Secondary | ICD-10-CM | POA: Diagnosis not present

## 2022-07-29 DIAGNOSIS — E785 Hyperlipidemia, unspecified: Secondary | ICD-10-CM | POA: Insufficient documentation

## 2022-07-29 DIAGNOSIS — Z86010 Personal history of colonic polyps: Secondary | ICD-10-CM | POA: Insufficient documentation

## 2022-07-29 DIAGNOSIS — K649 Unspecified hemorrhoids: Secondary | ICD-10-CM | POA: Diagnosis not present

## 2022-07-29 DIAGNOSIS — Z85828 Personal history of other malignant neoplasm of skin: Secondary | ICD-10-CM | POA: Diagnosis not present

## 2022-07-29 DIAGNOSIS — K317 Polyp of stomach and duodenum: Secondary | ICD-10-CM | POA: Diagnosis not present

## 2022-07-29 DIAGNOSIS — D124 Benign neoplasm of descending colon: Secondary | ICD-10-CM | POA: Insufficient documentation

## 2022-07-29 DIAGNOSIS — K640 First degree hemorrhoids: Secondary | ICD-10-CM | POA: Insufficient documentation

## 2022-07-29 DIAGNOSIS — I10 Essential (primary) hypertension: Secondary | ICD-10-CM | POA: Insufficient documentation

## 2022-07-29 DIAGNOSIS — Z6836 Body mass index (BMI) 36.0-36.9, adult: Secondary | ICD-10-CM | POA: Insufficient documentation

## 2022-07-29 DIAGNOSIS — D126 Benign neoplasm of colon, unspecified: Secondary | ICD-10-CM | POA: Diagnosis not present

## 2022-07-29 HISTORY — PX: COLONOSCOPY WITH PROPOFOL: SHX5780

## 2022-07-29 HISTORY — PX: ESOPHAGOGASTRODUODENOSCOPY (EGD) WITH PROPOFOL: SHX5813

## 2022-07-29 LAB — GLUCOSE, CAPILLARY
Glucose-Capillary: 160 mg/dL — ABNORMAL HIGH (ref 70–99)
Glucose-Capillary: 151 mg/dL — ABNORMAL HIGH (ref 70–99)

## 2022-07-29 SURGERY — COLONOSCOPY WITH PROPOFOL
Anesthesia: General

## 2022-07-29 MED ORDER — LIDOCAINE HCL (CARDIAC) PF 100 MG/5ML IV SOSY
PREFILLED_SYRINGE | INTRAVENOUS | Status: DC | PRN
  Administered 2022-07-29: 60 mg via INTRAVENOUS

## 2022-07-29 MED ORDER — PROPOFOL 10 MG/ML IV BOLUS
INTRAVENOUS | Status: DC | PRN
  Administered 2022-07-29: 60 mg via INTRAVENOUS
  Administered 2022-07-29: 20 mg via INTRAVENOUS

## 2022-07-29 MED ORDER — PROPOFOL 500 MG/50ML IV EMUL
INTRAVENOUS | Status: DC | PRN
  Administered 2022-07-29: 140 ug/kg/min via INTRAVENOUS

## 2022-07-29 MED ORDER — PROPOFOL 1000 MG/100ML IV EMUL
INTRAVENOUS | Status: AC
  Filled 2022-07-29: qty 100

## 2022-07-29 MED ORDER — SODIUM CHLORIDE 0.9 % IV SOLN
INTRAVENOUS | Status: DC
Start: 2022-07-29 — End: 2022-07-29

## 2022-07-29 NOTE — Op Note (Signed)
[facility] Gastroenterology Patient Name: Nicholas Acevedo Procedure Date: 07/29/2022 9:11 AM MRN: [id number] Account #: [id number] Date of Birth: December 03, 1946 Admit Type: Outpatient Age: 76 Room: [facility] ENDO ROOM 3 Gender: Male Note Status: Finalized Instrument Name: Jasper Riling [id number] Procedure:             Colonoscopy Indications:           Surveillance: Personal history of adenomatous polyps                         on last colonoscopy 5 years ago Providers:             Andrey Farmer MD, MD Referring MD:          [name] (Referring MD) Medicines:             Monitored Anesthesia Care Complications:         No immediate complications. Estimated blood loss:                         Minimal. Procedure:             Pre-Anesthesia Assessment:                        - Prior to the procedure, a History and Physical was                         performed, and patient medications and allergies were                         reviewed. The patient is competent. The risks and                         benefits of the procedure and the sedation options and                         risks were discussed with the patient. All questions                         were answered and informed consent was obtained.                         Patient identification and proposed procedure were                         verified by the physician, the nurse, the                         anesthesiologist, the anesthetist and the technician                         in the endoscopy suite. Mental Status Examination:                         alert and oriented. Airway Examination: normal                         oropharyngeal airway and neck mobility. Respiratory  Examination: clear to auscultation. CV Examination:                         normal. Prophylactic Antibiotics: The patient does not                         require prophylactic antibiotics. Prior                          Anticoagulants: The patient has taken no previous                         anticoagulant or antiplatelet agents. ASA Grade                         Assessment: III - A patient with severe systemic                         disease. After reviewing the risks and benefits, the                         patient was deemed in satisfactory condition to                         undergo the procedure. The anesthesia plan was to use                         monitored anesthesia care (MAC). Immediately prior to                         administration of medications, the patient was                         re-assessed for adequacy to receive sedatives. The                         heart rate, respiratory rate, oxygen saturations,                         blood pressure, adequacy of pulmonary ventilation, and                         response to care were monitored throughout the                         procedure. The physical status of the patient was                         re-assessed after the procedure.                        After obtaining informed consent, the colonoscope was                         passed under direct vision. Throughout the procedure,                         the patient's blood pressure, pulse, and oxygen  saturations were monitored continuously. The                         Colonoscope was introduced through the anus and                         advanced to the the cecum, identified by appendiceal                         orifice and ileocecal valve. The colonoscopy was                         somewhat difficult due to significant looping.                         Successful completion of the procedure was aided by                         applying abdominal pressure. The patient tolerated the                         procedure well. The quality of the bowel preparation                         was adequate to identify polyps. Findings:      The perianal and digital  rectal examinations were normal.      A 2 mm polyp was found in the descending colon. The polyp was sessile.       The polyp was removed with a jumbo cold forceps. Resection and retrieval       were complete. Estimated blood loss was minimal.      Multiple small-mouthed diverticula were found in the sigmoid colon and       descending colon.      Internal hemorrhoids were found during retroflexion. The hemorrhoids       were Grade I (internal hemorrhoids that do not prolapse).      The exam was otherwise without abnormality on direct and retroflexion       views. Impression:            - One 2 mm polyp in the descending colon, removed with                         a jumbo cold forceps. Resected and retrieved.                        - Diverticulosis in the sigmoid colon and in the                         descending colon.                        - Internal hemorrhoids.                        - The examination was otherwise normal on direct and                         retroflexion views. Recommendation:        - Discharge patient to home.                        -  Resume previous diet.                        - Continue present medications.                        - Await pathology results.                        - Repeat colonoscopy is not recommended due to current                         age (26 years or older) for surveillance.                        - Return to referring physician as previously                         scheduled. Procedure Code(s):     --- Professional ---                        [phone], Colonoscopy, flexible; with biopsy, single or                         multiple Diagnosis Code(s):     --- Professional ---                        Z86.010, Personal history of colonic polyps                        K63.5, Polyp of colon                        K64.0, First degree hemorrhoids                        K57.30, Diverticulosis of large intestine without                          perforation or abscess without bleeding CPT copyright 2019 American Medical Association. All rights reserved. The codes documented in this report are preliminary and upon coder review may  be revised to meet current compliance requirements. Andrey Farmer MD, MD 07/29/2022 10:05:33 AM Number of Addenda: 0 Note Initiated On: 07/29/2022 9:11 AM Scope Withdrawal Time: 0 hours 13 minutes 33 seconds  Total Procedure Duration: 0 hours 23 minutes 35 seconds  Estimated Blood Loss:  Estimated blood loss was minimal.      [facility]

## 2022-07-29 NOTE — Transfer of Care (Signed)
Immediate Anesthesia Transfer of Care Note  Patient: Nicholas Acevedo  Procedure(s) Performed: COLONOSCOPY WITH PROPOFOL ESOPHAGOGASTRODUODENOSCOPY (EGD) WITH PROPOFOL  Patient Location: PACU  Anesthesia Type:General  Level of Consciousness: sedated  Airway & Oxygen Therapy: Patient Spontanous Breathing  Post-op Assessment: Report given to RN and Post -op Vital signs reviewed and stable  Post vital signs: Reviewed and stable  Last Vitals:  Vitals Value Taken Time  BP 98/58 07/29/22 1003  Temp 35.8 C 07/29/22 1001  Pulse 66 07/29/22 1003  Resp 14 07/29/22 1003  SpO2 97 % 07/29/22 1003    Last Pain:  Vitals:   07/29/22 1001  TempSrc:   PainSc: Asleep         Complications: No notable events documented.

## 2022-07-29 NOTE — H&P (Signed)
Outpatient short stay form Pre-procedure 07/29/2022  Lesly Rubenstein, MD  Primary Physician: [name], MD  Reason for visit:  GERD/Surveillance colonoscopy  History of present illness:    76 y/o gentleman with history of DM II, GERD, and HLD here for EGD/Colonoscopy for GERD and history of polyps. No blood thinners. No family history of GI malignancies. No abdominal surgeries.    Current Facility-Administered Medications:    0.9 %  sodium chloride infusion, , Intravenous, Continuous, Afnan Emberton, Hilton Cork, MD  Medications Prior to Admission  Medication Sig Dispense Refill Last Dose   ascorbic acid (VITAMIN C) 500 MG tablet Take by mouth.   Past Week   aspirin EC 81 MG tablet Take 81 mg by mouth daily.   Past Week   Cholecalciferol 50 MCG (2000 UT) CAPS Take by mouth.   Past Week   cyanocobalamin 1000 MCG tablet Take by mouth.   Past Week   dorzolamide-timolol (COSOPT) 22.3-6.8 MG/ML ophthalmic solution INSTILL 1 DROP INTO EACH EYE TWICE DAILY   Past Week   Dulaglutide 0.75 MG/0.5ML SOPN Inject into the skin.   Past Week   glipiZIDE (GLUCOTROL) 5 MG tablet TAKE 1 TABLET BY MOUTH TWICE DAILY BEFORE MEAL(S)   Past Week   hydrocortisone 2.5 % cream Use as directed. Instructions in handout 30 g 3 Past Week   latanoprost (XALATAN) 0.005 % ophthalmic solution 1 drop at bedtime.   Past Week   losartan (COZAAR) 50 MG tablet Take 50 mg by mouth daily.   Past Week   metFORMIN (GLUCOPHAGE) 1000 MG tablet Take 1,000 mg by mouth 2 (two) times daily with a meal.   Past Week   MOUNJARO 5 MG/0.5ML Pen Inject 5 mg into the skin once a week.   Past Week   Netarsudil-Latanoprost (ROCKLATAN) 0.02-0.005 % SOLN 1 drop nightly   Past Week   omeprazole (PRILOSEC OTC) 20 MG tablet Take by mouth.   Past Week   omeprazole (PRILOSEC) 40 MG capsule Take 40 mg by mouth daily.   Past Week   ONE TOUCH ULTRA TEST test strip USE ONE STRIP TO CHECK GLUCOSE ONCE DAILY  3 Past Week   sildenafil (REVATIO) 20 MG  tablet 2-5 tabs 1 hour prior to intercourse 15 tablet 0 Past Week   simvastatin (ZOCOR) 20 MG tablet Take 20 mg by mouth daily.   Past Week   tamsulosin (FLOMAX) 0.4 MG CAPS capsule Take 1 capsule by mouth once daily 90 capsule 3 Past Week     No Known Allergies   Past Medical History:  Diagnosis Date   Actinic keratosis 10/31/2007   R forearm - bx proven    Basal cell carcinoma 03/15/2007   L alar groove    Basal cell carcinoma 08/20/2018   L upper med clavicle    BCC (basal cell carcinoma of skin) 11/23/2021   Left lower cheek above jaw, EDC   Cancer ([place])    prostate   Diabetes mellitus without complication ([place])    ED (erectile dysfunction)    Esophagitis    GERD (gastroesophageal reflux disease)    Glaucoma    Hyperlipemia    Hypertension    Impotence of organic origin    Sleep apnea    Urinary obstruction     Review of systems:  Otherwise negative.    Physical Exam  Gen: Alert, oriented. Appears stated age.  HEENT: PERRLA. Lungs: No respiratory distress CV: RRR Abd: soft, benign, no masses Ext: No edema  Planned procedures: Proceed with EGD/colonoscopy. The patient understands the nature of the planned procedure, indications, risks, alternatives and potential complications including but not limited to bleeding, infection, perforation, damage to internal organs and possible oversedation/side effects from anesthesia. The patient agrees and gives consent to proceed.  Please refer to procedure notes for findings, recommendations and patient disposition/instructions.     Lesly Rubenstein, MD [facility]

## 2022-07-29 NOTE — Op Note (Signed)
[facility] Patient Name: Nicholas Acevedo Procedure Date: 07/29/2022 9:12 AM MRN: [id number] Account #: [id number] Date of Birth: Oct 10, 1946 Admit Type: Outpatient Age: 76 Room: [facility] ENDO ROOM 3 Gender: Male Note Status: Finalized Instrument Name: Michaelle Birks [id number] Procedure:             Upper GI endoscopy Indications:           Gastro-esophageal reflux disease Providers:             Andrey Farmer MD, MD Referring MD:          [name] (Referring MD) Medicines:             Monitored Anesthesia Care Complications:         No immediate complications. Estimated blood loss:                         Minimal. Procedure:             Pre-Anesthesia Assessment:                        - Prior to the procedure, a History and Physical was                         performed, and patient medications and allergies were                         reviewed. The patient is competent. The risks and                         benefits of the procedure and the sedation options and                         risks were discussed with the patient. All questions                         were answered and informed consent was obtained.                         Patient identification and proposed procedure were                         verified by the physician, the nurse, the                         anesthesiologist, the anesthetist and the technician                         in the endoscopy suite. Mental Status Examination:                         alert and oriented. Airway Examination: normal                         oropharyngeal airway and neck mobility. Respiratory                         Examination: clear to auscultation. CV Examination:  normal. Prophylactic Antibiotics: The patient does not                         require prophylactic antibiotics. Prior                         Anticoagulants: The patient has taken no previous                          anticoagulant or antiplatelet agents. ASA Grade                         Assessment: III - A patient with severe systemic                         disease. After reviewing the risks and benefits, the                         patient was deemed in satisfactory condition to                         undergo the procedure. The anesthesia plan was to use                         monitored anesthesia care (MAC). Immediately prior to                         administration of medications, the patient was                         re-assessed for adequacy to receive sedatives. The                         heart rate, respiratory rate, oxygen saturations,                         blood pressure, adequacy of pulmonary ventilation, and                         response to care were monitored throughout the                         procedure. The physical status of the patient was                         re-assessed after the procedure.                        After obtaining informed consent, the endoscope was                         passed under direct vision. Throughout the procedure,                         the patient's blood pressure, pulse, and oxygen                         saturations were monitored continuously. The Endoscope  was introduced through the mouth, and advanced to the                         second part of duodenum. The upper GI endoscopy was                         accomplished without difficulty. The patient tolerated                         the procedure well. Findings:      One tongue of salmon-colored mucosa was present. No other visible       abnormalities were present. The maximum longitudinal extent of these       esophageal mucosal changes was 1 cm in length. Biopsies were taken with       a cold forceps for histology. Estimated blood loss was minimal.      A small hiatal hernia was present.      A few small sessile fundic gland polyps with no  bleeding and no stigmata       of recent bleeding were found in the cardia.      The exam of the stomach was otherwise normal.      The examined duodenum was normal. 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 Biopsied.                        - Small hiatal hernia.                        - A few fundic gland polyps.                        - Normal examined duodenum. Recommendation:        - Await pathology results.                        - Perform a colonoscopy today. Procedure Code(s):     --- Professional ---                        [phone], Esophagogastroduodenoscopy, flexible,                         transoral; with biopsy, single or multiple Diagnosis Code(s):     --- Professional ---                        K22.70, Barrett's esophagus without dysplasia                        K44.9, Diaphragmatic hernia without obstruction or                         gangrene                        K31.7, Polyp of stomach and duodenum  K21.9, Gastro-esophageal reflux disease without                         esophagitis CPT copyright 2019 American Medical Association. All rights reserved. The codes documented in this report are preliminary and upon coder review may  be revised to meet current compliance requirements. Andrey Farmer MD, MD 07/29/2022 10:02:36 AM Number of Addenda: 0 Note Initiated On: 07/29/2022 9:12 AM Estimated Blood Loss:  Estimated blood loss was minimal.      [facility]

## 2022-07-29 NOTE — Anesthesia Postprocedure Evaluation (Signed)
Anesthesia Post Note  Patient: Nicholas Acevedo  Procedure(s) Performed: COLONOSCOPY WITH PROPOFOL ESOPHAGOGASTRODUODENOSCOPY (EGD) WITH PROPOFOL  Patient location during evaluation: PACU Anesthesia Type: General Level of consciousness: awake and oriented Pain management: satisfactory to patient Vital Signs Assessment: post-procedure vital signs reviewed and stable Respiratory status: spontaneous breathing and nonlabored ventilation Cardiovascular status: stable Anesthetic complications: no   No notable events documented.   Last Vitals:  Vitals:   07/29/22 1011 07/29/22 1021  BP: 122/86   Pulse: 75 66  Resp: 16 18  Temp: (!) 36 C (!) 36 C  SpO2: 98% 99%    Last Pain:  Vitals:   07/29/22 1021  TempSrc:   PainSc: 0-No pain                 VAN STAVEREN,Makenly [place]

## 2022-07-29 NOTE — Anesthesia Preprocedure Evaluation (Signed)
Anesthesia Evaluation  Patient identified by MRN, date of birth, ID band Patient awake    Reviewed: Allergy & Precautions, NPO status , Patient's Chart, lab work & pertinent test results  Airway Mallampati: III  TM Distance: >3 FB Neck ROM: Full    Dental  (+) Partial Lower, Partial Upper   Pulmonary neg pulmonary ROS, sleep apnea ,    Pulmonary exam normal breath sounds clear to auscultation       Cardiovascular Exercise Tolerance: Good hypertension, Pt. on medications negative cardio ROS Normal cardiovascular exam Rhythm:Regular     Neuro/Psych negative neurological ROS  negative psych ROS   GI/Hepatic negative GI ROS, Neg liver ROS, GERD  Medicated,  Endo/Other  negative endocrine ROSdiabetes, Well Controlled, Type 2, Oral Hypoglycemic AgentsMorbid obesity  Renal/GU negative Renal ROS  negative genitourinary   Musculoskeletal negative musculoskeletal ROS (+)   Abdominal (+) + obese,   Peds negative pediatric ROS (+)  Hematology negative hematology ROS (+)   Anesthesia Other Findings Past Medical History: 10/31/2007: Actinic keratosis     Comment:  R forearm - bx proven  03/15/2007: Basal cell carcinoma     Comment:  L alar groove  08/20/2018: Basal cell carcinoma     Comment:  L upper med clavicle  11/23/2021: BCC (basal cell carcinoma of skin)     Comment:  Left lower cheek above jaw, EDC No date: Cancer ([place])     Comment:  prostate No date: Diabetes mellitus without complication ([facility]CC) No date: ED (erectile dysfunction) No date: Esophagitis No date: GERD (gastroesophageal reflux disease) No date: Glaucoma No date: Hyperlipemia No date: Hypertension 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 No date: Sleep apnea No date: Urinary obstruction  Past Surgical History: No date: COLONOSCOPY 09/12/2017: COLONOSCOPY WITH PROPOFOL; N/A     Comment:  Procedure: COLONOSCOPY WITH PROPOFOL;  Surgeon:                Lollie Sails, MD;  Location: [facility] ENDOSCOPY;                Service: Endoscopy;  Laterality: N/A; No date: EYE SURGERY No date: PROSTATE CRYOABLATION  BMI    Body Mass Index: 36.26 kg/m      Reproductive/Obstetrics negative OB ROS                             Anesthesia Physical Anesthesia Plan  ASA: 3  Anesthesia Plan: General   Post-op Pain Management:    Induction: Intravenous  PONV Risk Score and Plan: Propofol infusion and TIVA  Airway Management Planned: Natural Airway  Additional Equipment:   Intra-op Plan:   Post-operative Plan:   Informed Consent: I have reviewed the patients History and Physical, chart, labs and discussed the procedure including the risks, benefits and alternatives for the proposed anesthesia with the patient or authorized representative who has indicated his/her understanding and acceptance.     Dental Advisory Given  Plan Discussed with: CRNA and Surgeon  Anesthesia Plan Comments:         Anesthesia Quick Evaluation

## 2022-07-29 NOTE — Interval H&P Note (Signed)
History and Physical Interval Note:  07/29/2022 9:18 AM  Nicholas Acevedo  has presented today for surgery, with the diagnosis of GERD,HX of Adenomatous polyp of colon.  The various methods of treatment have been discussed with the patient and family. After consideration of risks, benefits and other options for treatment, the patient has consented to  Procedure(s): COLONOSCOPY WITH PROPOFOL (N/A) ESOPHAGOGASTRODUODENOSCOPY (EGD) WITH PROPOFOL (N/A) as a surgical intervention.  The patient's history has been reviewed, patient examined, no change in status, stable for surgery.  I have reviewed the patient's chart and labs.  Questions were answered to the patient's satisfaction.     Lesly Rubenstein  Ok to proceed with EGD/Colonoscopy

## 2022-07-29 NOTE — Anesthesia Procedure Notes (Signed)
Date/Time: 07/29/2022 9:18 AM  Performed by: Johnna Acosta, CRNAPre-anesthesia Checklist: Patient identified, Emergency Drugs available, Suction available, Patient being monitored and Timeout performed Patient Re-evaluated:Patient Re-evaluated prior to induction Oxygen Delivery Method: Nasal cannula Preoxygenation: Pre-oxygenation with 100% oxygen Induction Type: IV induction

## 2022-08-01 ENCOUNTER — Encounter: Payer: Self-pay | Admitting: Gastroenterology

## 2022-08-02 LAB — SURGICAL PATHOLOGY

## 2022-08-05 DIAGNOSIS — H401133 Primary open-angle glaucoma, bilateral, severe stage: Secondary | ICD-10-CM | POA: Diagnosis not present

## 2022-09-02 DIAGNOSIS — E7800 Pure hypercholesterolemia, unspecified: Secondary | ICD-10-CM | POA: Diagnosis not present

## 2022-09-02 DIAGNOSIS — E1142 Type 2 diabetes mellitus with diabetic polyneuropathy: Secondary | ICD-10-CM | POA: Diagnosis not present

## 2022-09-09 DIAGNOSIS — N1831 Chronic kidney disease, stage 3a: Secondary | ICD-10-CM | POA: Diagnosis not present

## 2022-09-09 DIAGNOSIS — K219 Gastro-esophageal reflux disease without esophagitis: Secondary | ICD-10-CM | POA: Diagnosis not present

## 2022-09-09 DIAGNOSIS — E1142 Type 2 diabetes mellitus with diabetic polyneuropathy: Secondary | ICD-10-CM | POA: Diagnosis not present

## 2022-09-09 DIAGNOSIS — I129 Hypertensive chronic kidney disease with stage 1 through stage 4 chronic kidney disease, or unspecified chronic kidney disease: Secondary | ICD-10-CM | POA: Diagnosis not present

## 2022-09-09 DIAGNOSIS — Z0000 Encounter for general adult medical examination without abnormal findings: Secondary | ICD-10-CM | POA: Diagnosis not present

## 2022-09-09 DIAGNOSIS — E1122 Type 2 diabetes mellitus with diabetic chronic kidney disease: Secondary | ICD-10-CM | POA: Diagnosis not present

## 2022-09-09 DIAGNOSIS — Z1331 Encounter for screening for depression: Secondary | ICD-10-CM | POA: Diagnosis not present

## 2022-09-09 DIAGNOSIS — G4733 Obstructive sleep apnea (adult) (pediatric): Secondary | ICD-10-CM | POA: Diagnosis not present

## 2022-09-09 DIAGNOSIS — N401 Enlarged prostate with lower urinary tract symptoms: Secondary | ICD-10-CM | POA: Diagnosis not present

## 2022-09-09 DIAGNOSIS — C61 Malignant neoplasm of prostate: Secondary | ICD-10-CM | POA: Diagnosis not present

## 2022-10-11 DIAGNOSIS — Z85828 Personal history of other malignant neoplasm of skin: Secondary | ICD-10-CM | POA: Diagnosis not present

## 2022-10-11 DIAGNOSIS — H401110 Primary open-angle glaucoma, right eye, stage unspecified: Secondary | ICD-10-CM | POA: Diagnosis not present

## 2022-10-11 DIAGNOSIS — Z8042 Family history of malignant neoplasm of prostate: Secondary | ICD-10-CM | POA: Diagnosis not present

## 2022-10-11 DIAGNOSIS — Z8249 Family history of ischemic heart disease and other diseases of the circulatory system: Secondary | ICD-10-CM | POA: Diagnosis not present

## 2022-10-11 DIAGNOSIS — H401113 Primary open-angle glaucoma, right eye, severe stage: Secondary | ICD-10-CM | POA: Diagnosis not present

## 2022-10-11 DIAGNOSIS — Z7984 Long term (current) use of oral hypoglycemic drugs: Secondary | ICD-10-CM | POA: Diagnosis not present

## 2022-10-11 DIAGNOSIS — Z8546 Personal history of malignant neoplasm of prostate: Secondary | ICD-10-CM | POA: Diagnosis not present

## 2022-10-11 DIAGNOSIS — Z79899 Other long term (current) drug therapy: Secondary | ICD-10-CM | POA: Diagnosis not present

## 2022-10-11 DIAGNOSIS — E119 Type 2 diabetes mellitus without complications: Secondary | ICD-10-CM | POA: Diagnosis not present

## 2022-10-11 DIAGNOSIS — Z7982 Long term (current) use of aspirin: Secondary | ICD-10-CM | POA: Diagnosis not present

## 2022-10-18 ENCOUNTER — Other Ambulatory Visit: Payer: Medicare PPO

## 2022-10-18 DIAGNOSIS — Z8546 Personal history of malignant neoplasm of prostate: Secondary | ICD-10-CM | POA: Diagnosis not present

## 2022-10-19 LAB — PSA: Prostate Specific Ag, Serum: 0.7 ng/mL (ref 0.0–4.0)

## 2022-10-24 ENCOUNTER — Ambulatory Visit: Payer: Medicare PPO | Admitting: Urology

## 2022-10-24 ENCOUNTER — Encounter: Payer: Self-pay | Admitting: Urology

## 2022-10-24 VITALS — BP 158/98 | HR 90 | Ht 71.0 in | Wt 270.0 lb

## 2022-10-24 DIAGNOSIS — Z8546 Personal history of malignant neoplasm of prostate: Secondary | ICD-10-CM

## 2022-10-24 DIAGNOSIS — N401 Enlarged prostate with lower urinary tract symptoms: Secondary | ICD-10-CM

## 2022-10-24 MED ORDER — TAMSULOSIN HCL 0.4 MG PO CAPS: 0.4000 mg | ORAL_CAPSULE | Freq: Every day | ORAL | 3 refills | Status: DC

## 2022-10-24 NOTE — Progress Notes (Signed)
10/24/2022 11:30 AM   Nicholas Acevedo 1946-08-20 [id number]  Referring provider: [name], MD [place],  [place] [address]  Chief Complaint  Patient presents with   Prostate Cancer    Urologic history: 1.  T1c adenocarcinoma prostate -Biopsy August 2009 PSA 3.8; 24 cc gland -Prostate cryoablation 09/2008  2.  BPH with LUTS -Tamsulosin 0.4 mg daily  3.  Erectile dysfunction - PDE 5 inhibitor refractory  HPI: 76 y.o. male presents for annual follow-up.  Doing well since last visit No bothersome LUTS; remains on tamsulosin Denies dysuria, gross hematuria Denies flank, abdominal or pelvic pain At last visit he was complaining of ED and given a trial of sildenafil 20 mg which was not effective with titration to 100 mg PSA 10/18/2022 stable at 0.7   PMH: Past Medical History:  Diagnosis Date   Actinic keratosis 10/31/2007   R forearm - bx proven    Basal cell carcinoma 03/15/2007   L alar groove    Basal cell carcinoma 08/20/2018   L upper med clavicle    BCC (basal cell carcinoma of skin) 11/23/2021   Left lower cheek above jaw, EDC   Cancer ([place])    prostate   Diabetes mellitus without complication ([place])    ED (erectile dysfunction)    Esophagitis    GERD (gastroesophageal reflux disease)    Glaucoma    Hyperlipemia    Hypertension    Impotence of organic origin    Sleep apnea    Urinary obstruction     Surgical History: Past Surgical History:  Procedure Laterality Date   COLONOSCOPY     COLONOSCOPY WITH PROPOFOL N/A 09/12/2017   Procedure: COLONOSCOPY WITH PROPOFOL;  Surgeon: Lollie Sails, MD;  Location: [facility] ENDOSCOPY;  Service: Endoscopy;  Laterality: N/A;   COLONOSCOPY WITH PROPOFOL N/A 07/29/2022   Procedure: COLONOSCOPY WITH PROPOFOL;  Surgeon: Lesly Rubenstein, MD;  Location: [facility] ENDOSCOPY;  Service: Endoscopy;  Laterality: N/A;   ESOPHAGOGASTRODUODENOSCOPY (EGD) WITH PROPOFOL N/A 07/29/2022   Procedure:  ESOPHAGOGASTRODUODENOSCOPY (EGD) WITH PROPOFOL;  Surgeon: Lesly Rubenstein, MD;  Location: [facility] ENDOSCOPY;  Service: Endoscopy;  Laterality: N/A;   EYE SURGERY     PROSTATE CRYOABLATION      Home Medications:  Allergies as of 10/24/2022   No Known Allergies      Medication List        Accurate as of October 24, 2022 11:30 AM. If you have any questions, ask your nurse or doctor.          ascorbic acid 500 MG tablet Commonly known as: VITAMIN C Take by mouth.   aspirin EC 81 MG tablet Take 81 mg by mouth daily.   Cholecalciferol 50 MCG (2000 UT) Caps Take by mouth.   cyanocobalamin 1000 MCG tablet Take by mouth.   dorzolamide-timolol 2-0.5 % ophthalmic solution Commonly known as: COSOPT INSTILL 1 DROP INTO EACH EYE TWICE DAILY   Dulaglutide 0.75 MG/0.5ML Sopn Inject into the skin.   glipiZIDE 5 MG tablet Commonly known as: GLUCOTROL TAKE 1 TABLET BY MOUTH TWICE DAILY BEFORE MEAL(S)   hydrocortisone 2.5 % cream Use as directed. Instructions in handout   latanoprost 0.005 % ophthalmic solution Commonly known as: XALATAN 1 drop at bedtime.   losartan 50 MG tablet Commonly known as: COZAAR Take 50 mg by mouth daily.   metFORMIN 1000 MG tablet Commonly known as: GLUCOPHAGE Take 1,000 mg by mouth 2 (two) times daily with a meal.  Mounjaro 5 MG/0.5ML Pen Generic drug: tirzepatide Inject 5 mg into the skin once a week.   omeprazole 20 MG tablet Commonly known as: PRILOSEC OTC Take by mouth.   omeprazole 40 MG capsule Commonly known as: PRILOSEC Take 40 mg by mouth daily.   ONE TOUCH ULTRA TEST test strip Generic drug: glucose blood USE ONE STRIP TO CHECK GLUCOSE ONCE DAILY   Rocklatan 0.02-0.005 % Soln Generic drug: Netarsudil-Latanoprost 1 drop nightly   sildenafil 20 MG tablet Commonly known as: REVATIO 2-5 tabs 1 hour prior to intercourse   simvastatin 20 MG tablet Commonly known as: ZOCOR Take 20 mg by mouth daily.   tamsulosin 0.4  MG Caps capsule Commonly known as: FLOMAX Take 1 capsule by mouth once daily        Allergies: No Known Allergies  Family History: No family history on file.  Social History:  reports that he has never smoked. He has never used smokeless tobacco. He reports that he does not drink alcohol and does not use drugs.   Physical Exam: BP (!) 158/98   Pulse 90   Ht '5\' 11"'$  (1.803 m)   Wt 270 lb (122.5 kg)   BMI 37.66 kg/m   Constitutional:  Alert and oriented, No acute distress. HEENT: [place] AT, moist mucus membranes.  Trachea midline, no masses. Cardiovascular: No clubbing, cyanosis, or edema. Respiratory: Normal respiratory effort, no increased work of breathing. GI: Abdomen is soft, nontender, nondistended, no abdominal masses GU: No CVA tenderness Skin: No rashes, bruises or suspicious lesions. Neurologic: Grossly intact, no focal deficits, moving all 4 extremities. Psychiatric: Normal mood and affect.   Assessment & Plan:    1.  History of prostate cancer Stable PSA  2.  BPH with LUTS Stable lower urinary tract symptoms on tamsulosin Refill sent to pharmacy   Follow-up 1 year with [place], MD  [place] [address] [place], [place] [address] [phone]

## 2022-11-04 DIAGNOSIS — E1159 Type 2 diabetes mellitus with other circulatory complications: Secondary | ICD-10-CM | POA: Diagnosis not present

## 2022-11-04 DIAGNOSIS — I152 Hypertension secondary to endocrine disorders: Secondary | ICD-10-CM | POA: Diagnosis not present

## 2022-11-04 DIAGNOSIS — E1169 Type 2 diabetes mellitus with other specified complication: Secondary | ICD-10-CM | POA: Diagnosis not present

## 2022-11-04 DIAGNOSIS — E1165 Type 2 diabetes mellitus with hyperglycemia: Secondary | ICD-10-CM | POA: Diagnosis not present

## 2022-11-04 DIAGNOSIS — E785 Hyperlipidemia, unspecified: Secondary | ICD-10-CM | POA: Diagnosis not present

## 2022-12-12 ENCOUNTER — Ambulatory Visit: Payer: Medicare PPO | Admitting: Dermatology

## 2022-12-22 ENCOUNTER — Ambulatory Visit: Payer: Medicare PPO | Admitting: Dermatology

## 2023-03-17 DIAGNOSIS — E7800 Pure hypercholesterolemia, unspecified: Secondary | ICD-10-CM | POA: Diagnosis not present

## 2023-03-20 DIAGNOSIS — E1142 Type 2 diabetes mellitus with diabetic polyneuropathy: Secondary | ICD-10-CM | POA: Diagnosis not present

## 2023-03-20 DIAGNOSIS — E785 Hyperlipidemia, unspecified: Secondary | ICD-10-CM | POA: Diagnosis not present

## 2023-03-20 DIAGNOSIS — E1159 Type 2 diabetes mellitus with other circulatory complications: Secondary | ICD-10-CM | POA: Diagnosis not present

## 2023-03-20 DIAGNOSIS — E1169 Type 2 diabetes mellitus with other specified complication: Secondary | ICD-10-CM | POA: Diagnosis not present

## 2023-03-20 DIAGNOSIS — I152 Hypertension secondary to endocrine disorders: Secondary | ICD-10-CM | POA: Diagnosis not present

## 2023-03-31 DIAGNOSIS — E1165 Type 2 diabetes mellitus with hyperglycemia: Secondary | ICD-10-CM | POA: Diagnosis not present

## 2023-04-20 DIAGNOSIS — G4733 Obstructive sleep apnea (adult) (pediatric): Secondary | ICD-10-CM | POA: Diagnosis not present

## 2023-04-20 DIAGNOSIS — E1142 Type 2 diabetes mellitus with diabetic polyneuropathy: Secondary | ICD-10-CM | POA: Diagnosis not present

## 2023-04-20 DIAGNOSIS — N138 Other obstructive and reflux uropathy: Secondary | ICD-10-CM | POA: Diagnosis not present

## 2023-04-20 DIAGNOSIS — N1831 Chronic kidney disease, stage 3a: Secondary | ICD-10-CM | POA: Diagnosis not present

## 2023-04-20 DIAGNOSIS — I129 Hypertensive chronic kidney disease with stage 1 through stage 4 chronic kidney disease, or unspecified chronic kidney disease: Secondary | ICD-10-CM | POA: Diagnosis not present

## 2023-04-20 DIAGNOSIS — E7800 Pure hypercholesterolemia, unspecified: Secondary | ICD-10-CM | POA: Diagnosis not present

## 2023-04-20 DIAGNOSIS — K219 Gastro-esophageal reflux disease without esophagitis: Secondary | ICD-10-CM | POA: Diagnosis not present

## 2023-04-20 DIAGNOSIS — N401 Enlarged prostate with lower urinary tract symptoms: Secondary | ICD-10-CM | POA: Diagnosis not present

## 2023-04-30 DIAGNOSIS — E1165 Type 2 diabetes mellitus with hyperglycemia: Secondary | ICD-10-CM | POA: Diagnosis not present

## 2023-05-02 DIAGNOSIS — I209 Angina pectoris, unspecified: Secondary | ICD-10-CM | POA: Diagnosis not present

## 2023-05-02 DIAGNOSIS — I440 Atrioventricular block, first degree: Secondary | ICD-10-CM | POA: Diagnosis not present

## 2023-05-02 DIAGNOSIS — I10 Essential (primary) hypertension: Secondary | ICD-10-CM | POA: Diagnosis not present

## 2023-05-02 DIAGNOSIS — R079 Chest pain, unspecified: Secondary | ICD-10-CM | POA: Diagnosis not present

## 2023-05-02 DIAGNOSIS — E7849 Other hyperlipidemia: Secondary | ICD-10-CM | POA: Diagnosis not present

## 2023-05-02 DIAGNOSIS — R0609 Other forms of dyspnea: Secondary | ICD-10-CM | POA: Diagnosis not present

## 2023-05-15 DIAGNOSIS — I25119 Atherosclerotic heart disease of native coronary artery with unspecified angina pectoris: Secondary | ICD-10-CM | POA: Diagnosis not present

## 2023-05-15 DIAGNOSIS — R9439 Abnormal result of other cardiovascular function study: Secondary | ICD-10-CM | POA: Diagnosis not present

## 2023-05-15 DIAGNOSIS — R931 Abnormal findings on diagnostic imaging of heart and coronary circulation: Secondary | ICD-10-CM | POA: Diagnosis not present

## 2023-05-15 DIAGNOSIS — I209 Angina pectoris, unspecified: Secondary | ICD-10-CM | POA: Diagnosis not present

## 2023-05-15 DIAGNOSIS — R079 Chest pain, unspecified: Secondary | ICD-10-CM | POA: Diagnosis not present

## 2023-05-15 DIAGNOSIS — R0609 Other forms of dyspnea: Secondary | ICD-10-CM | POA: Diagnosis not present

## 2023-05-17 DIAGNOSIS — I77810 Thoracic aortic ectasia: Secondary | ICD-10-CM | POA: Diagnosis not present

## 2023-05-30 DIAGNOSIS — E1142 Type 2 diabetes mellitus with diabetic polyneuropathy: Secondary | ICD-10-CM | POA: Diagnosis not present

## 2023-05-30 DIAGNOSIS — B351 Tinea unguium: Secondary | ICD-10-CM | POA: Diagnosis not present

## 2023-05-30 DIAGNOSIS — M79675 Pain in left toe(s): Secondary | ICD-10-CM | POA: Diagnosis not present

## 2023-05-30 DIAGNOSIS — M79674 Pain in right toe(s): Secondary | ICD-10-CM | POA: Diagnosis not present

## 2023-05-30 DIAGNOSIS — Z794 Long term (current) use of insulin: Secondary | ICD-10-CM | POA: Diagnosis not present

## 2023-05-31 DIAGNOSIS — I10 Essential (primary) hypertension: Secondary | ICD-10-CM | POA: Diagnosis not present

## 2023-05-31 DIAGNOSIS — I77810 Thoracic aortic ectasia: Secondary | ICD-10-CM | POA: Diagnosis not present

## 2023-05-31 DIAGNOSIS — R0609 Other forms of dyspnea: Secondary | ICD-10-CM | POA: Diagnosis not present

## 2023-05-31 DIAGNOSIS — I209 Angina pectoris, unspecified: Secondary | ICD-10-CM | POA: Diagnosis not present

## 2023-05-31 DIAGNOSIS — R9439 Abnormal result of other cardiovascular function study: Secondary | ICD-10-CM | POA: Diagnosis not present

## 2023-05-31 DIAGNOSIS — E7849 Other hyperlipidemia: Secondary | ICD-10-CM | POA: Diagnosis not present

## 2023-05-31 DIAGNOSIS — I440 Atrioventricular block, first degree: Secondary | ICD-10-CM | POA: Diagnosis not present

## 2023-05-31 DIAGNOSIS — E1165 Type 2 diabetes mellitus with hyperglycemia: Secondary | ICD-10-CM | POA: Diagnosis not present

## 2023-06-05 DIAGNOSIS — I25119 Atherosclerotic heart disease of native coronary artery with unspecified angina pectoris: Secondary | ICD-10-CM | POA: Diagnosis not present

## 2023-06-05 DIAGNOSIS — I209 Angina pectoris, unspecified: Secondary | ICD-10-CM | POA: Diagnosis not present

## 2023-06-05 DIAGNOSIS — I25110 Atherosclerotic heart disease of native coronary artery with unstable angina pectoris: Secondary | ICD-10-CM | POA: Diagnosis not present

## 2023-06-05 DIAGNOSIS — N3001 Acute cystitis with hematuria: Secondary | ICD-10-CM | POA: Diagnosis not present

## 2023-06-05 DIAGNOSIS — C61 Malignant neoplasm of prostate: Secondary | ICD-10-CM | POA: Diagnosis not present

## 2023-06-05 DIAGNOSIS — R972 Elevated prostate specific antigen [PSA]: Secondary | ICD-10-CM | POA: Diagnosis not present

## 2023-06-05 DIAGNOSIS — E1122 Type 2 diabetes mellitus with diabetic chronic kidney disease: Secondary | ICD-10-CM | POA: Diagnosis not present

## 2023-06-05 DIAGNOSIS — I700 Atherosclerosis of aorta: Secondary | ICD-10-CM | POA: Diagnosis not present

## 2023-06-05 DIAGNOSIS — N183 Chronic kidney disease, stage 3 unspecified: Secondary | ICD-10-CM | POA: Diagnosis not present

## 2023-06-05 DIAGNOSIS — I440 Atrioventricular block, first degree: Secondary | ICD-10-CM | POA: Diagnosis not present

## 2023-06-05 DIAGNOSIS — R9439 Abnormal result of other cardiovascular function study: Secondary | ICD-10-CM | POA: Diagnosis not present

## 2023-06-07 ENCOUNTER — Encounter: Payer: Medicare PPO | Attending: Cardiology | Admitting: *Deleted

## 2023-06-07 DIAGNOSIS — Z955 Presence of coronary angioplasty implant and graft: Secondary | ICD-10-CM | POA: Insufficient documentation

## 2023-06-07 NOTE — Progress Notes (Signed)
Virtual orientation call completed today. he has an appointment on Date: 06/20/2023  for EP eval and gym Orientation.  Documentation of diagnosis can be found in [facility] Date: 06/05/2023 .

## 2023-06-08 DIAGNOSIS — E1165 Type 2 diabetes mellitus with hyperglycemia: Secondary | ICD-10-CM | POA: Diagnosis not present

## 2023-06-09 DIAGNOSIS — H401133 Primary open-angle glaucoma, bilateral, severe stage: Secondary | ICD-10-CM | POA: Diagnosis not present

## 2023-06-09 DIAGNOSIS — Z955 Presence of coronary angioplasty implant and graft: Secondary | ICD-10-CM | POA: Diagnosis not present

## 2023-06-20 VITALS — Ht 71.0 in | Wt 260.0 lb

## 2023-06-20 DIAGNOSIS — Z955 Presence of coronary angioplasty implant and graft: Secondary | ICD-10-CM

## 2023-06-20 NOTE — Progress Notes (Signed)
Cardiac Individual Treatment Plan  Patient Details  Name: Nicholas Acevedo MRN: [id number] Date of Birth: 1946/12/21 Referring Provider:   Flowsheet Row Cardiac Rehab from 06/20/2023 in [facility] Cardiac and Pulmonary Rehab  Referring Provider Dr. Massie Maroon, MD       Initial Encounter Date:  Flowsheet Row Cardiac Rehab from 06/20/2023 in [facility] Cardiac and Pulmonary Rehab  Date 06/20/23       Visit Diagnosis: Status post coronary artery stent placement  Patient's Home Medications on Admission:  Current Outpatient Medications:    ascorbic acid (VITAMIN C) 500 MG tablet, Take by mouth. (Patient not taking: Reported on 06/07/2023), Disp: , Rfl:    aspirin EC 81 MG tablet, Take 81 mg by mouth daily., Disp: , Rfl:    atorvastatin (LIPITOR) 40 MG tablet, Take 40 mg by mouth daily., Disp: , Rfl:    brimonidine (ALPHAGAN) 0.2 % ophthalmic solution, Place 1 drop into both eyes 3 (three) times daily., Disp: , Rfl:    Cholecalciferol 50 MCG (2000 UT) CAPS, Take by mouth. (Patient not taking: Reported on 06/07/2023), Disp: , Rfl:    clopidogrel (PLAVIX) 75 MG tablet, Take 75 mg by mouth daily., Disp: , Rfl:    cyanocobalamin 1000 MCG tablet, Take by mouth. (Patient not taking: Reported on 06/07/2023), Disp: , Rfl:    dorzolamide-timolol (COSOPT) 22.3-6.8 MG/ML ophthalmic solution, , Disp: , Rfl:    Dulaglutide 0.75 MG/0.5ML SOPN, Inject into the skin. (Patient not taking: Reported on 06/07/2023), Disp: , Rfl:    glipiZIDE (GLUCOTROL) 5 MG tablet, TAKE 1 TABLET BY MOUTH TWICE DAILY BEFORE MEAL(S), Disp: , Rfl:    hydrocortisone 2.5 % cream, Use as directed. Instructions in handout, Disp: 30 g, Rfl: 3   LANTUS SOLOSTAR 100 UNIT/ML Solostar Pen, Inject 45 Units into the skin daily. AM, Disp: , Rfl:    latanoprost (XALATAN) 0.005 % ophthalmic solution, 1 drop at bedtime. (Patient not taking: Reported on 06/07/2023), Disp: , Rfl:    losartan (COZAAR) 50 MG tablet, Take 50 mg by mouth daily., Disp: , Rfl:     metFORMIN (GLUCOPHAGE) 1000 MG tablet, Take 1,000 mg by mouth 2 (two) times daily with a meal., Disp: , Rfl:    MOUNJARO 5 MG/0.5ML Pen, Inject 5 mg into the skin once a week., Disp: , Rfl:    Netarsudil-Latanoprost (ROCKLATAN) 0.02-0.005 % SOLN, 1 drop nightly, Disp: , Rfl:    omeprazole (PRILOSEC OTC) 20 MG tablet, Take by mouth., Disp: , Rfl:    omeprazole (PRILOSEC) 40 MG capsule, Take 40 mg by mouth daily. (Patient not taking: Reported on 06/07/2023), Disp: , Rfl:    ONE TOUCH ULTRA TEST test strip, USE ONE STRIP TO CHECK GLUCOSE ONCE DAILY, Disp: , Rfl: 3   sildenafil (REVATIO) 20 MG tablet, 2-5 tabs 1 hour prior to intercourse (Patient not taking: Reported on 06/07/2023), Disp: 15 tablet, Rfl: 0   tamsulosin (FLOMAX) 0.4 MG CAPS capsule, Take 1 capsule (0.4 mg total) by mouth daily., Disp: 90 capsule, Rfl: 3  Past Medical History: Past Medical History:  Diagnosis Date   Actinic keratosis 10/31/2007   R forearm - bx proven    Basal cell carcinoma 03/15/2007   L alar groove    Basal cell carcinoma 08/20/2018   L upper med clavicle    BCC (basal cell carcinoma of skin) 11/23/2021   Left lower cheek above jaw, EDC   Cancer (HCC)    prostate   Diabetes mellitus without complication (HCC)  ED (erectile dysfunction)    Esophagitis    GERD (gastroesophageal reflux disease)    Glaucoma    Hyperlipemia    Hypertension    Impotence of organic origin    Sleep apnea    Urinary obstruction     Tobacco Use: Social History   Tobacco Use  Smoking Status Never  Smokeless Tobacco Never    Labs: Review Flowsheet        No data to display           Exercise Target Goals: Exercise Program Goal: Individual exercise prescription set using results from initial 6 min walk test and THRR while considering  patient's activity barriers and safety.   Exercise Prescription Goal: Initial exercise prescription builds to 30-45 minutes a day of aerobic activity, 2-3 days per week.  Home  exercise guidelines will be given to patient during program as part of exercise prescription that the participant will acknowledge.   Education: Aerobic Exercise: - Group verbal and visual presentation on the components of exercise prescription. Introduces F.I.T.T principle from ACSM for exercise prescriptions.  Reviews F.I.T.T. principles of aerobic exercise including progression. Written material given at graduation. Flowsheet Row Cardiac Rehab from 06/20/2023 in [facility] Cardiac and Pulmonary Rehab  Education need identified 06/20/23       Education: Resistance Exercise: - Group verbal and visual presentation on the components of exercise prescription. Introduces F.I.T.T principle from ACSM for exercise prescriptions  Reviews F.I.T.T. principles of resistance exercise including progression. Written material given at graduation.    Education: Exercise & Equipment Safety: - Individual verbal instruction and demonstration of equipment use and safety with use of the equipment. Flowsheet Row Cardiac Rehab from 06/20/2023 in [facility] Cardiac and Pulmonary Rehab  Date 06/20/23  Educator NT  Instruction Review Code 1- Verbalizes Understanding       Education: Exercise Physiology & General Exercise Guidelines: - Group verbal and written instruction with models to review the exercise physiology of the cardiovascular system and associated critical values. Provides general exercise guidelines with specific guidelines to those with heart or lung disease.    Education: Flexibility, Balance, Mind/Body Relaxation: - Group verbal and visual presentation with interactive activity on the components of exercise prescription. Introduces F.I.T.T principle from ACSM for exercise prescriptions. Reviews F.I.T.T. principles of flexibility and balance exercise training including progression. Also discusses the mind body connection.  Reviews various relaxation techniques to help reduce and manage stress (i.e. Deep  breathing, progressive muscle relaxation, and visualization). Balance handout provided to take home. Written material given at graduation.   Activity Barriers & Risk Stratification:  Activity Barriers & Cardiac Risk Stratification - 06/20/23 1506       Activity Barriers & Cardiac Risk Stratification   Activity Barriers Other (comment)    Comments Glaucoma impedes vision    Cardiac Risk Stratification Moderate             6 Minute Walk:  6 Minute Walk     Row Name 06/20/23 1505         6 Minute Walk   Phase Initial     Distance 1130 feet     Walk Time 6 minutes     # of Rest Breaks 0     MPH 2.14     METS 1.92     RPE 11     Perceived Dyspnea  1     VO2 Peak 6.71     Symptoms No     Resting HR 71 bpm  Resting BP 126/74     Resting Oxygen Saturation  97 %     Exercise Oxygen Saturation  during 6 min walk 95 %     Max Ex. HR 104 bpm     Max Ex. BP 144/68     2 Minute Post BP 136/74              Oxygen Initial Assessment:   Oxygen Re-Evaluation:   Oxygen Discharge (Final Oxygen Re-Evaluation):   Initial Exercise Prescription:  Initial Exercise Prescription - 06/20/23 1500       Date of Initial Exercise RX and Referring Provider   Date 06/20/23    Referring Provider Dr. Massie Maroon, MD      Oxygen   Maintain Oxygen Saturation 88% or higher      Recumbant Bike   Level 1    RPM 50    Watts 15    Minutes 15    METs 1.92      NuStep   Level 2    SPM 80    Minutes 15    METs 1.92      Biostep-RELP   Level 1    SPM 50    Minutes 15    METs 1.92      Prescription Details   Frequency (times per week) 2    Duration Progress to 30 minutes of continuous aerobic without signs/symptoms of physical distress      Intensity   THRR 40-80% of Max Heartrate 99-128    Ratings of Perceived Exertion 11-13    Perceived Dyspnea 0-4      Progression   Progression Continue to progress workloads to maintain intensity without signs/symptoms of  physical distress.      Resistance Training   Training Prescription Yes    Weight 4 lb    Reps 10-15             Perform Capillary Blood Glucose checks as needed.  Exercise Prescription Changes:   Exercise Prescription Changes     Row Name 06/20/23 1500             Response to Exercise   Blood Pressure (Admit) 126/74       Blood Pressure (Exercise) 144/68       Blood Pressure (Exit) 136/74       Heart Rate (Admit) 71 bpm       Heart Rate (Exercise) 104 bpm       Heart Rate (Exit) 87 bpm       Oxygen Saturation (Admit) 97 %       Oxygen Saturation (Exercise) 95 %       Rating of Perceived Exertion (Exercise) 11       Perceived Dyspnea (Exercise) 1       Symptoms None       Comments Results                Exercise Comments:   Exercise Goals and Review:   Exercise Goals     Row Name 06/20/23 1239             Exercise Goals   Increase Physical Activity Yes       Intervention Develop an individualized exercise prescription for aerobic and resistive training based on initial evaluation findings, risk stratification, comorbidities and participant's personal goals.;Provide advice, education, support and counseling about physical activity/exercise needs.       Expected Outcomes Short Term: Attend rehab on a regular basis to increase amount  of physical activity.;Long Term: Exercising regularly at least 3-5 days a week.;Long Term: Add in home exercise to make exercise part of routine and to increase amount of physical activity.       Increase Strength and Stamina Yes       Intervention Provide advice, education, support and counseling about physical activity/exercise needs.;Develop an individualized exercise prescription for aerobic and resistive training based on initial evaluation findings, risk stratification, comorbidities and participant's personal goals.       Expected Outcomes Short Term: Increase workloads from initial exercise prescription for  resistance, speed, and METs.;Short Term: Perform resistance training exercises routinely during rehab and add in resistance training at home;Long Term: Improve cardiorespiratory fitness, muscular endurance and strength as measured by increased METs and functional capacity ( )       Able to understand and use rate of perceived exertion (RPE) scale Yes       Intervention Provide education and explanation on how to use RPE scale       Expected Outcomes Short Term: Able to use RPE daily in rehab to express subjective intensity level;Long Term:  Able to use RPE to guide intensity level when exercising independently       Able to understand and use Dyspnea scale Yes       Intervention Provide education and explanation on how to use Dyspnea scale       Expected Outcomes Short Term: Able to use Dyspnea scale daily in rehab to express subjective sense of shortness of breath during exertion;Long Term: Able to use Dyspnea scale to guide intensity level when exercising independently       Knowledge and understanding of Target Heart Rate Range (THRR) Yes       Intervention Provide education and explanation of THRR including how the numbers were predicted and where they are located for reference       Expected Outcomes Short Term: Able to state/look up THRR;Long Term: Able to use THRR to govern intensity when exercising independently;Short Term: Able to use daily as guideline for intensity in rehab       Able to check pulse independently Yes       Intervention Provide education and demonstration on how to check pulse in carotid and radial arteries.;Review the importance of being able to check your own pulse for safety during independent exercise       Expected Outcomes Short Term: Able to explain why pulse checking is important during independent exercise;Long Term: Able to check pulse independently and accurately       Understanding of Exercise Prescription Yes       Intervention Provide education, explanation,  and written materials on patient's individual exercise prescription       Expected Outcomes Short Term: Able to explain program exercise prescription;Long Term: Able to explain home exercise prescription to exercise independently                Exercise Goals Re-Evaluation :   Discharge Exercise Prescription (Final Exercise Prescription Changes):  Exercise Prescription Changes - 06/20/23 1500       Response to Exercise   Blood Pressure (Admit) 126/74    Blood Pressure (Exercise) 144/68    Blood Pressure (Exit) 136/74    Heart Rate (Admit) 71 bpm    Heart Rate (Exercise) 104 bpm    Heart Rate (Exit) 87 bpm    Oxygen Saturation (Admit) 97 %    Oxygen Saturation (Exercise) 95 %    Rating of Perceived Exertion (Exercise) 11  Perceived Dyspnea (Exercise) 1    Symptoms None    Comments Results             Nutrition:  Target Goals: Understanding of nutrition guidelines, daily intake of sodium 1500mg , cholesterol 200mg , calories 30% from fat and 7% or less from saturated fats, daily to have 5 or more servings of fruits and vegetables.  Education: All About Nutrition: -Group instruction provided by verbal, written material, interactive activities, discussions, models, and posters to present general guidelines for heart healthy nutrition including fat, fiber, MyPlate, the role of sodium in heart healthy nutrition, utilization of the nutrition label, and utilization of this knowledge for meal planning. Follow up email sent as well. Written material given at graduation.   Biometrics:  Pre Biometrics - 06/20/23 1507       Pre Biometrics   Height 5\' 11"  (1.803 m)    Weight 260 lb (117.9 kg)    Waist Circumference 47.5 inches    Hip Circumference 45.5 inches    Waist to Hip Ratio 1.04 %    BMI (Calculated) 36.28    Single Leg Stand 3.35 seconds              Nutrition Therapy Plan and Nutrition Goals:  Nutrition Therapy & Goals - 06/20/23 1239        Intervention Plan   Intervention Prescribe, educate and counsel regarding individualized specific dietary modifications aiming towards targeted core components such as weight, hypertension, lipid management, diabetes, heart failure and other comorbidities.    Expected Outcomes Short Term Goal: Understand basic principles of dietary content, such as calories, fat, sodium, cholesterol and nutrients.;Long Term Goal: Adherence to prescribed nutrition plan.             Nutrition Assessments:  MEDIFICTS Score Key: ?70 Need to make dietary changes  40-70 Heart Healthy Diet ? 40 Therapeutic Level Cholesterol Diet  Flowsheet Row Cardiac Rehab from 06/20/2023 in [facility] Cardiac and Pulmonary Rehab  Picture Your Plate Total Score on Admission 46      Picture Your Plate Scores: <56 Unhealthy dietary pattern with much room for improvement. 41-50 Dietary pattern unlikely to meet recommendations for good health and room for improvement. 51-60 More healthful dietary pattern, with some room for improvement.  >60 Healthy dietary pattern, although there may be some specific behaviors that could be improved.    Nutrition Goals Re-Evaluation:   Nutrition Goals Discharge (Final Nutrition Goals Re-Evaluation):   Psychosocial: Target Goals: Acknowledge presence or absence of significant depression and/or stress, maximize coping skills, provide positive support system. Participant is able to verbalize types and ability to use techniques and skills needed for reducing stress and depression.   Education: Stress, Anxiety, and Depression - Group verbal and visual presentation to define topics covered.  Reviews how body is impacted by stress, anxiety, and depression.  Also discusses healthy ways to reduce stress and to treat/manage anxiety and depression.  Written material given at graduation.   Education: Sleep Hygiene -Provides group verbal and written instruction about how sleep can affect your health.   Define sleep hygiene, discuss sleep cycles and impact of sleep habits. Review good sleep hygiene tips.    Initial Review & Psychosocial Screening:  Initial Psych Review & Screening - 06/07/23 1023       Initial Review   Current issues with Current Stress Concerns    Source of Stress Concerns Financial;Family      Family Dynamics   Good Support System? Yes   wife,  daughter lives behind him     Barriers   Psychosocial barriers to participate in program There are no identifiable barriers or psychosocial needs.      Screening Interventions   Interventions To provide support and resources with identified psychosocial needs;Provide feedback about the scores to participant    Expected Outcomes Short Term goal: Utilizing psychosocial counselor, staff and physician to assist with identification of specific Stressors or current issues interfering with healing process. Setting desired goal for each stressor or current issue identified.;Long Term Goal: Stressors or current issues are controlled or eliminated.;Short Term goal: Identification and review with participant of any Quality of Life or Depression concerns found by scoring the questionnaire.;Long Term goal: The participant improves quality of Life and PHQ9 Scores as seen by post scores and/or verbalization of changes             Quality of Life Scores:   Quality of Life - 06/20/23 1523       Quality of Life   Select Quality of Life      Quality of Life Scores   Health/Function Pre 18.27 %    Socioeconomic Pre 18.86 %    Psych/Spiritual Pre 23.36 %    Family Pre 20.4 %    GLOBAL Pre 19.75 %            Scores of 19 and below usually indicate a poorer quality of life in these areas.  A difference of  2-3 points is a clinically meaningful difference.  A difference of 2-3 points in the total score of the Quality of Life Index has been associated with significant improvement in overall quality of life, self-image, physical  symptoms, and general health in studies assessing change in quality of life.  PHQ-9: Review Flowsheet       06/20/2023  Depression screen PHQ 2/9  Decreased Interest 0  Down, Depressed, Hopeless 0  PHQ - 2 Score 0  Altered sleeping 0  Tired, decreased energy 3  Change in appetite 0  Feeling bad or failure about yourself  0  Trouble concentrating 0  Moving slowly or fidgety/restless 0  Suicidal thoughts 0  PHQ-9 Score 3  Difficult doing work/chores Not difficult at all   Interpretation of Total Score  Total Score Depression Severity:  1-4 = Minimal depression, 5-9 = Mild depression, 10-14 = Moderate depression, 15-19 = Moderately severe depression, 20-27 = Severe depression   Psychosocial Evaluation and Intervention:  Psychosocial Evaluation - 06/07/23 1053       Psychosocial Evaluation & Interventions   Interventions Encouraged to exercise with the program and follow exercise prescription    Comments Ysidro has no barriers to attending the program. He wants to increase his stamina and be as healthy as he can. He does have concern with financial and family matters.  His adult son recently had an MI. Shawan will be attending the same CR sessions as his son.  He is ready to start the program.    Expected Outcomes STG Mahki attends all scheduled sessions and is able to decrease his conerns about his son's health. He can find resources to help him with his financial concerns. lTG Tomothy is able to cintinue with his exercise progression with his son.    Continue Psychosocial Services  Follow up required by staff             Psychosocial Re-Evaluation:   Psychosocial Discharge (Final Psychosocial Re-Evaluation):   Vocational Rehabilitation: Provide vocational rehab assistance to qualifying candidates.  Vocational Rehab Evaluation & Intervention:   Education: Education Goals: Education classes will be provided on a variety of topics geared toward better understanding of  heart health and risk factor modification. Participant will state understanding/return demonstration of topics presented as noted by education test scores.  Learning Barriers/Preferences:   General Cardiac Education Topics:  AED/CPR: - Group verbal and written instruction with the use of models to demonstrate the basic use of the AED with the basic ABC's of resuscitation.   Anatomy and Cardiac Procedures: - Group verbal and visual presentation and models provide information about basic cardiac anatomy and function. Reviews the testing methods done to diagnose heart disease and the outcomes of the test results. Describes the treatment choices: Medical Management, Angioplasty, or Coronary Bypass Surgery for treating various heart conditions including Myocardial Infarction, Angina, Valve Disease, and Cardiac Arrhythmias.  Written material given at graduation. Flowsheet Row Cardiac Rehab from 06/20/2023 in [facility] Cardiac and Pulmonary Rehab  Education need identified 06/20/23       Medication Safety: - Group verbal and visual instruction to review commonly prescribed medications for heart and lung disease. Reviews the medication, class of the drug, and side effects. Includes the steps to properly store meds and maintain the prescription regimen.  Written material given at graduation.   Intimacy: - Group verbal instruction through game format to discuss how heart and lung disease can affect sexual intimacy. Written material given at graduation..   Know Your Numbers and Heart Failure: - Group verbal and visual instruction to discuss disease risk factors for cardiac and pulmonary disease and treatment options.  Reviews associated critical values for Overweight/Obesity, Hypertension, Cholesterol, and Diabetes.  Discusses basics of heart failure: signs/symptoms and treatments.  Introduces Heart Failure Zone chart for action plan for heart failure.  Written material given at graduation.   Infection  Prevention: - Provides verbal and written material to individual with discussion of infection control including proper hand washing and proper equipment cleaning during exercise session. Flowsheet Row Cardiac Rehab from 06/20/2023 in [facility] Cardiac and Pulmonary Rehab  Date 06/20/23  Educator NT  Instruction Review Code 1- Verbalizes Understanding       Falls Prevention: - Provides verbal and written material to individual with discussion of falls prevention and safety. Flowsheet Row Cardiac Rehab from 06/20/2023 in [facility] Cardiac and Pulmonary Rehab  Date 06/07/23  Educator SB  Instruction Review Code 1- Verbalizes Understanding       Other: -Provides group and verbal instruction on various topics (see comments)   Knowledge Questionnaire Score:  Knowledge Questionnaire Score - 06/20/23 1524       Knowledge Questionnaire Score   Pre Score 21/26             Core Components/Risk Factors/Patient Goals at Admission:  Personal Goals and Risk Factors at Admission - 06/07/23 1025       Core Components/Risk Factors/Patient Goals on Admission   Intervention Weight Management: Develop a combined nutrition and exercise program designed to reach desired caloric intake, while maintaining appropriate intake of nutrient and fiber, sodium and fats, and appropriate energy expenditure required for the weight goal.;Weight Management: Provide education and appropriate resources to help participant work on and attain dietary goals.    Admit Weight 260 lb (117.9 kg)    Goal Weight: Short Term 258 lb (117 kg)    Goal Weight: Long Term 220 lb (99.8 kg)    Expected Outcomes Short Term: Continue to assess and modify interventions until short term weight is achieved;Long  Term: Adherence to nutrition and physical activity/exercise program aimed toward attainment of established weight goal;Weight Loss: Understanding of general recommendations for a balanced deficit meal plan, which promotes 1-2 lb weight  loss per week and includes a negative energy balance of [phone] kcal/d    Diabetes Yes    Intervention Provide education about signs/symptoms and action to take for hypo/hyperglycemia.;Provide education about proper nutrition, including hydration, and aerobic/resistive exercise prescription along with prescribed medications to achieve blood glucose in normal ranges: Fasting glucose 65-99 mg/dL    Expected Outcomes Short Term: Participant verbalizes understanding of the signs/symptoms and immediate care of hyper/hypoglycemia, proper foot care and importance of medication, aerobic/resistive exercise and nutrition plan for blood glucose control.;Long Term: Attainment of HbA1C < 7%.    Hypertension Yes    Intervention Provide education on lifestyle modifcations including regular physical activity/exercise, weight management, moderate sodium restriction and increased consumption of fresh fruit, vegetables, and low fat dairy, alcohol moderation, and smoking cessation.;Monitor prescription use compliance.    Expected Outcomes Short Term: Continued assessment and intervention until BP is < 140/60mm HG in hypertensive participants. < 130/34mm HG in hypertensive participants with diabetes, heart failure or chronic kidney disease.;Long Term: Maintenance of blood pressure at goal levels.    Lipids Yes    Intervention Provide education and support for participant on nutrition & aerobic/resistive exercise along with prescribed medications to achieve LDL 70mg , HDL >40mg .    Expected Outcomes Short Term: Participant states understanding of desired cholesterol values and is compliant with medications prescribed. Participant is following exercise prescription and nutrition guidelines.;Long Term: Cholesterol controlled with medications as prescribed, with individualized exercise RX and with personalized nutrition plan. Value goals: LDL < 70mg , HDL > 40 mg.             Education:Diabetes - Individual verbal and  written instruction to review signs/symptoms of diabetes, desired ranges of glucose level fasting, after meals and with exercise. Acknowledge that pre and post exercise glucose checks will be done for 3 sessions at entry of program.   Core Components/Risk Factors/Patient Goals Review:    Core Components/Risk Factors/Patient Goals at Discharge (Final Review):    ITP Comments:  ITP Comments     Row Name 06/07/23 1051 06/20/23 1238         ITP Comments Virtual orientation call completed today. he has an appointment on Date: 06/20/2023  for EP eval and gym Orientation.  Documentation of diagnosis can be found in [facility] Date: 06/05/2023 . Completed and gym orientation. Initial ITP created and sent for review to Dr. Bethann Punches, Medical Director.               Comments: Initial ITP

## 2023-06-20 NOTE — Patient Instructions (Signed)
Patient Instructions  Patient Details  Name: Nicholas Acevedo MRN: [id number] Date of Birth: 04-Feb-1946 Referring Provider:  Lovenia Shuck, MD  Below are your personal goals for exercise, nutrition, and risk factors. Our goal is to help you stay on track towards obtaining and maintaining these goals. We will be discussing your progress on these goals with you throughout the program.  Initial Exercise Prescription:  Initial Exercise Prescription - 06/20/23 1500       Date of Initial Exercise RX and Referring Provider   Date 06/20/23    Referring Provider Dr. Massie Maroon, MD      Oxygen   Maintain Oxygen Saturation 88% or higher      Recumbant Bike   Level 1    RPM 50    Watts 15    Minutes 15    METs 1.92      NuStep   Level 2    SPM 80    Minutes 15    METs 1.92      Biostep-RELP   Level 1    SPM 50    Minutes 15    METs 1.92      Prescription Details   Frequency (times per week) 2    Duration Progress to 30 minutes of continuous aerobic without signs/symptoms of physical distress      Intensity   THRR 40-80% of Max Heartrate 99-128    Ratings of Perceived Exertion 11-13    Perceived Dyspnea 0-4      Progression   Progression Continue to progress workloads to maintain intensity without signs/symptoms of physical distress.      Resistance Training   Training Prescription Yes    Weight 4 lb    Reps 10-15             Exercise Goals: Frequency: Be able to perform aerobic exercise two to three times per week in program working toward 2-5 days per week of home exercise.  Intensity: Work with a perceived exertion of 11 (fairly light) - 15 (hard) while following your exercise prescription.  We will make changes to your prescription with you as you progress through the program.   Duration: Be able to do 30 to 45 minutes of continuous aerobic exercise in addition to a 5 minute warm-up and a 5 minute cool-down routine.   Nutrition Goals: Your personal nutrition  goals will be established when you do your nutrition analysis with the dietician.  The following are general nutrition guidelines to follow: Cholesterol < 200mg /day Sodium < 1500mg /day Fiber: Men over 50 yrs - 30 grams per day  Personal Goals:  Personal Goals and Risk Factors at Admission - 06/07/23 1025       Core Components/Risk Factors/Patient Goals on Admission   Intervention Weight Management: Develop a combined nutrition and exercise program designed to reach desired caloric intake, while maintaining appropriate intake of nutrient and fiber, sodium and fats, and appropriate energy expenditure required for the weight goal.;Weight Management: Provide education and appropriate resources to help participant work on and attain dietary goals.    Admit Weight 260 lb (117.9 kg)    Goal Weight: Short Term 258 lb (117 kg)    Goal Weight: Long Term 220 lb (99.8 kg)    Expected Outcomes Short Term: Continue to assess and modify interventions until short term weight is achieved;Long Term: Adherence to nutrition and physical activity/exercise program aimed toward attainment of established weight goal;Weight Loss: Understanding of general recommendations for a balanced deficit meal  plan, which promotes 1-2 lb weight loss per week and includes a negative energy balance of [id number] kcal/d    Diabetes Yes    Intervention Provide education about signs/symptoms and action to take for hypo/hyperglycemia.;Provide education about proper nutrition, including hydration, and aerobic/resistive exercise prescription along with prescribed medications to achieve blood glucose in normal ranges: Fasting glucose 65-99 mg/dL    Expected Outcomes Short Term: Participant verbalizes understanding of the signs/symptoms and immediate care of hyper/hypoglycemia, proper foot care and importance of medication, aerobic/resistive exercise and nutrition plan for blood glucose control.;Long Term: Attainment of HbA1C < 7%.     Hypertension Yes    Intervention Provide education on lifestyle modifcations including regular physical activity/exercise, weight management, moderate sodium restriction and increased consumption of fresh fruit, vegetables, and low fat dairy, alcohol moderation, and smoking cessation.;Monitor prescription use compliance.    Expected Outcomes Short Term: Continued assessment and intervention until BP is < 140/37mm HG in hypertensive participants. < 130/43mm HG in hypertensive participants with diabetes, heart failure or chronic kidney disease.;Long Term: Maintenance of blood pressure at goal levels.    Lipids Yes    Intervention Provide education and support for participant on nutrition & aerobic/resistive exercise along with prescribed medications to achieve LDL 70mg , HDL >40mg .    Expected Outcomes Short Term: Participant states understanding of desired cholesterol values and is compliant with medications prescribed. Participant is following exercise prescription and nutrition guidelines.;Long Term: Cholesterol controlled with medications as prescribed, with individualized exercise RX and with personalized nutrition plan. Value goals: LDL < 70mg , HDL > 40 mg.            Exercise Goals and Review:  Exercise Goals     Row Name 06/20/23 1239             Exercise Goals   Increase Physical Activity Yes       Intervention Develop an individualized exercise prescription for aerobic and resistive training based on initial evaluation findings, risk stratification, comorbidities and participant's personal goals.;Provide advice, education, support and counseling about physical activity/exercise needs.       Expected Outcomes Short Term: Attend rehab on a regular basis to increase amount of physical activity.;Long Term: Exercising regularly at least 3-5 days a week.;Long Term: Add in home exercise to make exercise part of routine and to increase amount of physical activity.       Increase Strength and  Stamina Yes       Intervention Provide advice, education, support and counseling about physical activity/exercise needs.;Develop an individualized exercise prescription for aerobic and resistive training based on initial evaluation findings, risk stratification, comorbidities and participant's personal goals.       Expected Outcomes Short Term: Increase workloads from initial exercise prescription for resistance, speed, and METs.;Short Term: Perform resistance training exercises routinely during rehab and add in resistance training at home;Long Term: Improve cardiorespiratory fitness, muscular endurance and strength as measured by increased METs and functional capacity ( )       Able to understand and use rate of perceived exertion (RPE) scale Yes       Intervention Provide education and explanation on how to use RPE scale       Expected Outcomes Short Term: Able to use RPE daily in rehab to express subjective intensity level;Long Term:  Able to use RPE to guide intensity level when exercising independently       Able to understand and use Dyspnea scale Yes       Intervention Provide education and  explanation on how to use Dyspnea scale       Expected Outcomes Short Term: Able to use Dyspnea scale daily in rehab to express subjective sense of shortness of breath during exertion;Long Term: Able to use Dyspnea scale to guide intensity level when exercising independently       Knowledge and understanding of Target Heart Rate Range (THRR) Yes       Intervention Provide education and explanation of THRR including how the numbers were predicted and where they are located for reference       Expected Outcomes Short Term: Able to state/look up THRR;Long Term: Able to use THRR to govern intensity when exercising independently;Short Term: Able to use daily as guideline for intensity in rehab       Able to check pulse independently Yes       Intervention Provide education and demonstration on how to check pulse  in carotid and radial arteries.;Review the importance of being able to check your own pulse for safety during independent exercise       Expected Outcomes Short Term: Able to explain why pulse checking is important during independent exercise;Long Term: Able to check pulse independently and accurately       Understanding of Exercise Prescription Yes       Intervention Provide education, explanation, and written materials on patient's individual exercise prescription       Expected Outcomes Short Term: Able to explain program exercise prescription;Long Term: Able to explain home exercise prescription to exercise independently

## 2023-06-22 ENCOUNTER — Encounter: Payer: Medicare PPO | Admitting: *Deleted

## 2023-06-22 DIAGNOSIS — Z955 Presence of coronary angioplasty implant and graft: Secondary | ICD-10-CM

## 2023-06-22 LAB — GLUCOSE, CAPILLARY
Glucose-Capillary: 122 mg/dL — ABNORMAL HIGH (ref 70–99)
Glucose-Capillary: 110 mg/dL — ABNORMAL HIGH (ref 70–99)

## 2023-06-22 NOTE — Progress Notes (Signed)
Daily Session Note  Patient Details  Name: Nicholas Acevedo MRN: [id number] Date of Birth: 06/07/1946 Referring Provider:   Flowsheet Row Cardiac Rehab from 06/20/2023 in [facility] Cardiac and Pulmonary Rehab  Referring Provider Dr. Massie Maroon, MD       Encounter Date: 06/22/2023  Check In:  Session Check In - 06/22/23 1027       Check-In   Supervising physician immediately available to respond to emergencies See telemetry face sheet for immediately available ER MD    Location [facility]-Cardiac & Pulmonary Rehab    Staff Present Lanny Hurst, RN, ADN;Meredith Jewel Baize, RN BSN;Joseph [place], RCP,RRT,BSRT;Other   [place], MS   Virtual Visit No    Medication changes reported     No    Fall or balance concerns reported    No    Warm-up and Cool-down Performed on first and last piece of equipment    Resistance Training Performed Yes    VAD Patient? No    PAD/SET Patient? No      Pain Assessment   Currently in Pain? No/denies                Social History   Tobacco Use  Smoking Status Never  Smokeless Tobacco Never    Goals Met:  Independence with exercise equipment Exercise tolerated well No report of concerns or symptoms today Strength training completed today  Goals Unmet:  Not Applicable  Comments: First full day of exercise!  Patient was oriented to gym and equipment including functions, settings, policies, and procedures.  Patient's individual exercise prescription and treatment plan were reviewed.  All starting workloads were established based on the results of the 6 minute walk test done at initial orientation visit.  The plan for exercise progression was also introduced and progression will be customized based on patient's performance and goals.    Dr. Bethann Punches is Medical Director for [facility] Cardiac Rehabilitation.  Dr. Vida Rigger is Medical Director for [facility] Pulmonary Rehabilitation.

## 2023-06-27 ENCOUNTER — Encounter: Payer: Medicare PPO | Attending: Cardiology | Admitting: *Deleted

## 2023-06-27 DIAGNOSIS — Z48812 Encounter for surgical aftercare following surgery on the circulatory system: Secondary | ICD-10-CM | POA: Insufficient documentation

## 2023-06-27 DIAGNOSIS — E119 Type 2 diabetes mellitus without complications: Secondary | ICD-10-CM | POA: Insufficient documentation

## 2023-06-27 DIAGNOSIS — Z955 Presence of coronary angioplasty implant and graft: Secondary | ICD-10-CM | POA: Insufficient documentation

## 2023-06-27 LAB — GLUCOSE, CAPILLARY
Glucose-Capillary: 93 mg/dL (ref 70–99)
Glucose-Capillary: 97 mg/dL (ref 70–99)

## 2023-06-27 NOTE — Progress Notes (Signed)
Incomplete Session Note  Patient Details  Name: SHAQUIELLE GRANGER MRN: [id number] Date of Birth: [birth year] Referring Provider:   Flowsheet Row Cardiac Rehab from 06/20/2023 in [facility] Cardiac and Pulmonary Rehab  Referring Provider Dr. Massie Maroon, MD       Edwin Cap did not complete his rehab session.  Pt's FSBS 97 at check in and below parameters to begin exercise today. Applesauce given x 2 and 3 pieces of candy. FSBS rechecked 20 min later and was 92. Pt asymptomatic. Pt ate pack of crackers prior to leaving. Education provided. Pt stable upon leaving rehab.

## 2023-06-30 DIAGNOSIS — E1142 Type 2 diabetes mellitus with diabetic polyneuropathy: Secondary | ICD-10-CM | POA: Diagnosis not present

## 2023-06-30 DIAGNOSIS — E1165 Type 2 diabetes mellitus with hyperglycemia: Secondary | ICD-10-CM | POA: Diagnosis not present

## 2023-07-04 ENCOUNTER — Encounter: Payer: Medicare PPO | Admitting: *Deleted

## 2023-07-04 ENCOUNTER — Encounter: Payer: Self-pay | Admitting: *Deleted

## 2023-07-04 DIAGNOSIS — E119 Type 2 diabetes mellitus without complications: Secondary | ICD-10-CM | POA: Diagnosis not present

## 2023-07-04 DIAGNOSIS — Z955 Presence of coronary angioplasty implant and graft: Secondary | ICD-10-CM

## 2023-07-04 DIAGNOSIS — Z48812 Encounter for surgical aftercare following surgery on the circulatory system: Secondary | ICD-10-CM | POA: Diagnosis not present

## 2023-07-04 LAB — GLUCOSE, CAPILLARY
Glucose-Capillary: 139 mg/dL — ABNORMAL HIGH (ref 70–99)
Glucose-Capillary: 77 mg/dL (ref 70–99)

## 2023-07-04 NOTE — Progress Notes (Signed)
Cardiac Individual Treatment Plan  Patient Details  Name: Nicholas Acevedo MRN: [id number] Date of Birth: Jan 29, 1946 Referring Provider:   Flowsheet Row Cardiac Rehab from 06/20/2023 in [facility] Cardiac and Pulmonary Rehab  Referring Provider Dr. Massie Maroon, MD       Initial Encounter Date:  Flowsheet Row Cardiac Rehab from 06/20/2023 in [facility] Cardiac and Pulmonary Rehab  Date 06/20/23       Visit Diagnosis: Status post coronary artery stent placement  Patient's Home Medications on Admission:  Current Outpatient Medications:    ascorbic acid (VITAMIN C) 500 MG tablet, Take by mouth. (Patient not taking: Reported on 06/07/2023), Disp: , Rfl:    aspirin EC 81 MG tablet, Take 81 mg by mouth daily., Disp: , Rfl:    atorvastatin (LIPITOR) 40 MG tablet, Take 40 mg by mouth daily., Disp: , Rfl:    brimonidine (ALPHAGAN) 0.2 % ophthalmic solution, Place 1 drop into both eyes 3 (three) times daily., Disp: , Rfl:    Cholecalciferol 50 MCG (2000 UT) CAPS, Take by mouth. (Patient not taking: Reported on 06/07/2023), Disp: , Rfl:    clopidogrel (PLAVIX) 75 MG tablet, Take 75 mg by mouth daily., Disp: , Rfl:    cyanocobalamin 1000 MCG tablet, Take by mouth. (Patient not taking: Reported on 06/07/2023), Disp: , Rfl:    dorzolamide-timolol (COSOPT) 22.3-6.8 MG/ML ophthalmic solution, , Disp: , Rfl:    Dulaglutide 0.75 MG/0.5ML SOPN, Inject into the skin. (Patient not taking: Reported on 06/07/2023), Disp: , Rfl:    glipiZIDE (GLUCOTROL) 5 MG tablet, TAKE 1 TABLET BY MOUTH TWICE DAILY BEFORE MEAL(S), Disp: , Rfl:    hydrocortisone 2.5 % cream, Use as directed. Instructions in handout, Disp: 30 g, Rfl: 3   LANTUS SOLOSTAR 100 UNIT/ML Solostar Pen, Inject 45 Units into the skin daily. AM, Disp: , Rfl:    latanoprost (XALATAN) 0.005 % ophthalmic solution, 1 drop at bedtime. (Patient not taking: Reported on 06/07/2023), Disp: , Rfl:    losartan (COZAAR) 50 MG tablet, Take 50 mg by mouth daily., Disp: , Rfl:     metFORMIN (GLUCOPHAGE) 1000 MG tablet, Take 1,000 mg by mouth 2 (two) times daily with a meal., Disp: , Rfl:    MOUNJARO 5 MG/0.5ML Pen, Inject 5 mg into the skin once a week., Disp: , Rfl:    Netarsudil-Latanoprost (ROCKLATAN) 0.02-0.005 % SOLN, 1 drop nightly, Disp: , Rfl:    omeprazole (PRILOSEC OTC) 20 MG tablet, Take by mouth., Disp: , Rfl:    omeprazole (PRILOSEC) 40 MG capsule, Take 40 mg by mouth daily. (Patient not taking: Reported on 06/07/2023), Disp: , Rfl:    ONE TOUCH ULTRA TEST test strip, USE ONE STRIP TO CHECK GLUCOSE ONCE DAILY, Disp: , Rfl: 3   sildenafil (REVATIO) 20 MG tablet, 2-5 tabs 1 hour prior to intercourse (Patient not taking: Reported on 06/07/2023), Disp: 15 tablet, Rfl: 0   tamsulosin (FLOMAX) 0.4 MG CAPS capsule, Take 1 capsule (0.4 mg total) by mouth daily., Disp: 90 capsule, Rfl: 3  Past Medical History: Past Medical History:  Diagnosis Date   Actinic keratosis 10/31/2007   R forearm - bx proven    Basal cell carcinoma 03/15/2007   L alar groove    Basal cell carcinoma 08/20/2018   L upper med clavicle    BCC (basal cell carcinoma of skin) 11/23/2021   Left lower cheek above jaw, EDC   Cancer (HCC)    prostate   Diabetes mellitus without complication (HCC)  ED (erectile dysfunction)    Esophagitis    GERD (gastroesophageal reflux disease)    Glaucoma    Hyperlipemia    Hypertension    Impotence of organic origin    Sleep apnea    Urinary obstruction     Tobacco Use: Social History   Tobacco Use  Smoking Status Never  Smokeless Tobacco Never    Labs: Review Flowsheet        No data to display           Exercise Target Goals: Exercise Program Goal: Individual exercise prescription set using results from initial 6 min walk test and THRR while considering  patient's activity barriers and safety.   Exercise Prescription Goal: Initial exercise prescription builds to 30-45 minutes a day of aerobic activity, 2-3 days per week.  Home  exercise guidelines will be given to patient during program as part of exercise prescription that the participant will acknowledge.   Education: Aerobic Exercise: - Group verbal and visual presentation on the components of exercise prescription. Introduces F.I.T.T principle from ACSM for exercise prescriptions.  Reviews F.I.T.T. principles of aerobic exercise including progression. Written material given at graduation. Flowsheet Row Cardiac Rehab from 06/22/2023 in [facility] Cardiac and Pulmonary Rehab  Education need identified 06/20/23       Education: Resistance Exercise: - Group verbal and visual presentation on the components of exercise prescription. Introduces F.I.T.T principle from ACSM for exercise prescriptions  Reviews F.I.T.T. principles of resistance exercise including progression. Written material given at graduation.    Education: Exercise & Equipment Safety: - Individual verbal instruction and demonstration of equipment use and safety with use of the equipment. Flowsheet Row Cardiac Rehab from 06/22/2023 in [facility] Cardiac and Pulmonary Rehab  Date 06/20/23  Educator NT  Instruction Review Code 1- Verbalizes Understanding       Education: Exercise Physiology & General Exercise Guidelines: - Group verbal and written instruction with models to review the exercise physiology of the cardiovascular system and associated critical values. Provides general exercise guidelines with specific guidelines to those with heart or lung disease.    Education: Flexibility, Balance, Mind/Body Relaxation: - Group verbal and visual presentation with interactive activity on the components of exercise prescription. Introduces F.I.T.T principle from ACSM for exercise prescriptions. Reviews F.I.T.T. principles of flexibility and balance exercise training including progression. Also discusses the mind body connection.  Reviews various relaxation techniques to help reduce and manage stress (i.e. Deep  breathing, progressive muscle relaxation, and visualization). Balance handout provided to take home. Written material given at graduation.   Activity Barriers & Risk Stratification:  Activity Barriers & Cardiac Risk Stratification - 06/20/23 1506       Activity Barriers & Cardiac Risk Stratification   Activity Barriers Other (comment)    Comments Glaucoma impedes vision    Cardiac Risk Stratification Moderate             6 Minute Walk:  6 Minute Walk     Row Name 06/20/23 1505         6 Minute Walk   Phase Initial     Distance 1130 feet     Walk Time 6 minutes     # of Rest Breaks 0     MPH 2.14     METS 1.92     RPE 11     Perceived Dyspnea  1     VO2 Peak 6.71     Symptoms No     Resting HR 71 bpm  Resting BP 126/74     Resting Oxygen Saturation  97 %     Exercise Oxygen Saturation  during 6 min walk 95 %     Max Ex. HR 104 bpm     Max Ex. BP 144/68     2 Minute Post BP 136/74              Oxygen Initial Assessment:   Oxygen Re-Evaluation:   Oxygen Discharge (Final Oxygen Re-Evaluation):   Initial Exercise Prescription:  Initial Exercise Prescription - 06/20/23 1500       Date of Initial Exercise RX and Referring Provider   Date 06/20/23    Referring Provider Dr. Massie Maroon, MD      Oxygen   Maintain Oxygen Saturation 88% or higher      Recumbant Bike   Level 1    RPM 50    Watts 15    Minutes 15    METs 1.92      NuStep   Level 2    SPM 80    Minutes 15    METs 1.92      Biostep-RELP   Level 1    SPM 50    Minutes 15    METs 1.92      Prescription Details   Frequency (times per week) 2    Duration Progress to 30 minutes of continuous aerobic without signs/symptoms of physical distress      Intensity   THRR 40-80% of Max Heartrate 99-128    Ratings of Perceived Exertion 11-13    Perceived Dyspnea 0-4      Progression   Progression Continue to progress workloads to maintain intensity without signs/symptoms of  physical distress.      Resistance Training   Training Prescription Yes    Weight 4 lb    Reps 10-15             Perform Capillary Blood Glucose checks as needed.  Exercise Prescription Changes:   Exercise Prescription Changes     Row Name 06/20/23 1500 06/22/23 1000           Response to Exercise   Blood Pressure (Admit) 126/74 126/74      Blood Pressure (Exercise) 144/68 144/68      Blood Pressure (Exit) 136/74 136/74      Heart Rate (Admit) 71 bpm 71 bpm      Heart Rate (Exercise) 104 bpm 104 bpm      Heart Rate (Exit) 87 bpm 87 bpm      Oxygen Saturation (Admit) 97 % 97 %      Oxygen Saturation (Exercise) 95 % 95 %      Rating of Perceived Exertion (Exercise) 11 11      Perceived Dyspnea (Exercise) 1 1      Symptoms None None      Comments Results Results        Resistance Training   Training Prescription -- Yes      Weight -- 4 lb      Reps -- 10-15        Recumbant Bike   Level -- 1      RPM -- 50      Watts -- 15      Minutes -- 15      METs -- 1.92        NuStep   Level -- 2      SPM -- 80      Minutes --  15      METs -- 1.92        Biostep-RELP   Level -- 1      SPM -- 50      Minutes -- 15      METs -- 1.92               Exercise Comments:   Exercise Comments     Row Name 06/22/23 1028           Exercise Comments First full day of exercise!  Patient was oriented to gym and equipment including functions, settings, policies, and procedures.  Patient's individual exercise prescription and treatment plan were reviewed.  All starting workloads were established based on the results of the 6 minute walk test done at initial orientation visit.  The plan for exercise progression was also introduced and progression will be customized based on patient's performance and goals.                Exercise Goals and Review:   Exercise Goals     Row Name 06/20/23 1239             Exercise Goals   Increase Physical  Activity Yes       Intervention Develop an individualized exercise prescription for aerobic and resistive training based on initial evaluation findings, risk stratification, comorbidities and participant's personal goals.;Provide advice, education, support and counseling about physical activity/exercise needs.       Expected Outcomes Short Term: Attend rehab on a regular basis to increase amount of physical activity.;Long Term: Exercising regularly at least 3-5 days a week.;Long Term: Add in home exercise to make exercise part of routine and to increase amount of physical activity.       Increase Strength and Stamina Yes       Intervention Provide advice, education, support and counseling about physical activity/exercise needs.;Develop an individualized exercise prescription for aerobic and resistive training based on initial evaluation findings, risk stratification, comorbidities and participant's personal goals.       Expected Outcomes Short Term: Increase workloads from initial exercise prescription for resistance, speed, and METs.;Short Term: Perform resistance training exercises routinely during rehab and add in resistance training at home;Long Term: Improve cardiorespiratory fitness, muscular endurance and strength as measured by increased METs and functional capacity ( )       Able to understand and use rate of perceived exertion (RPE) scale Yes       Intervention Provide education and explanation on how to use RPE scale       Expected Outcomes Short Term: Able to use RPE daily in rehab to express subjective intensity level;Long Term:  Able to use RPE to guide intensity level when exercising independently       Able to understand and use Dyspnea scale Yes       Intervention Provide education and explanation on how to use Dyspnea scale       Expected Outcomes Short Term: Able to use Dyspnea scale daily in rehab to express subjective sense of shortness of breath during exertion;Long Term: Able to  use Dyspnea scale to guide intensity level when exercising independently       Knowledge and understanding of Target Heart Rate Range (THRR) Yes       Intervention Provide education and explanation of THRR including how the numbers were predicted and where they are located for reference       Expected Outcomes Short Term: Able to state/look up THRR;Long Term: Able to  use THRR to govern intensity when exercising independently;Short Term: Able to use daily as guideline for intensity in rehab       Able to check pulse independently Yes       Intervention Provide education and demonstration on how to check pulse in carotid and radial arteries.;Review the importance of being able to check your own pulse for safety during independent exercise       Expected Outcomes Short Term: Able to explain why pulse checking is important during independent exercise;Long Term: Able to check pulse independently and accurately       Understanding of Exercise Prescription Yes       Intervention Provide education, explanation, and written materials on patient's individual exercise prescription       Expected Outcomes Short Term: Able to explain program exercise prescription;Long Term: Able to explain home exercise prescription to exercise independently                Exercise Goals Re-Evaluation :  Exercise Goals Re-Evaluation     Row Name 06/22/23 1028             Exercise Goal Re-Evaluation   Exercise Goals Review Able to understand and use rate of perceived exertion (RPE) scale;Able to understand and use Dyspnea scale;Knowledge and understanding of Target Heart Rate Range (THRR);Understanding of Exercise Prescription       Comments Reviewed RPE  and dyspnea scale, THR and program prescription with pt today.  Pt voiced understanding and was given a copy of goals to take home.       Expected Outcomes Short: Use RPE daily to regulate intensity. Long: Follow program prescription in THR.                 Discharge Exercise Prescription (Final Exercise Prescription Changes):  Exercise Prescription Changes - 06/22/23 1000       Response to Exercise   Blood Pressure (Admit) 126/74    Blood Pressure (Exercise) 144/68    Blood Pressure (Exit) 136/74    Heart Rate (Admit) 71 bpm    Heart Rate (Exercise) 104 bpm    Heart Rate (Exit) 87 bpm    Oxygen Saturation (Admit) 97 %    Oxygen Saturation (Exercise) 95 %    Rating of Perceived Exertion (Exercise) 11    Perceived Dyspnea (Exercise) 1    Symptoms None    Comments Results      Resistance Training   Training Prescription Yes    Weight 4 lb    Reps 10-15      Recumbant Bike   Level 1    RPM 50    Watts 15    Minutes 15    METs 1.92      NuStep   Level 2    SPM 80    Minutes 15    METs 1.92      Biostep-RELP   Level 1    SPM 50    Minutes 15    METs 1.92             Nutrition:  Target Goals: Understanding of nutrition guidelines, daily intake of sodium 1500mg , cholesterol 200mg , calories 30% from fat and 7% or less from saturated fats, daily to have 5 or more servings of fruits and vegetables.  Education: All About Nutrition: -Group instruction provided by verbal, written material, interactive activities, discussions, models, and posters to present general guidelines for heart healthy nutrition including fat, fiber, MyPlate, the role of sodium in heart  healthy nutrition, utilization of the nutrition label, and utilization of this knowledge for meal planning. Follow up email sent as well. Written material given at graduation.   Biometrics:  Pre Biometrics - 06/20/23 1507       Pre Biometrics   Height 5\' 11"  (1.803 m)    Weight 260 lb (117.9 kg)    Waist Circumference 47.5 inches    Hip Circumference 45.5 inches    Waist to Hip Ratio 1.04 %    BMI (Calculated) 36.28    Single Leg Stand 3.35 seconds              Nutrition Therapy Plan and Nutrition Goals:  Nutrition Therapy & Goals -  06/20/23 1239       Intervention Plan   Intervention Prescribe, educate and counsel regarding individualized specific dietary modifications aiming towards targeted core components such as weight, hypertension, lipid management, diabetes, heart failure and other comorbidities.    Expected Outcomes Short Term Goal: Understand basic principles of dietary content, such as calories, fat, sodium, cholesterol and nutrients.;Long Term Goal: Adherence to prescribed nutrition plan.             Nutrition Assessments:  MEDIFICTS Score Key: ?70 Need to make dietary changes  40-70 Heart Healthy Diet ? 40 Therapeutic Level Cholesterol Diet  Flowsheet Row Cardiac Rehab from 06/20/2023 in [facility] Cardiac and Pulmonary Rehab  Picture Your Plate Total Score on Admission 46      Picture Your Plate Scores: <16 Unhealthy dietary pattern with much room for improvement. 41-50 Dietary pattern unlikely to meet recommendations for good health and room for improvement. 51-60 More healthful dietary pattern, with some room for improvement.  >60 Healthy dietary pattern, although there may be some specific behaviors that could be improved.    Nutrition Goals Re-Evaluation:   Nutrition Goals Discharge (Final Nutrition Goals Re-Evaluation):   Psychosocial: Target Goals: Acknowledge presence or absence of significant depression and/or stress, maximize coping skills, provide positive support system. Participant is able to verbalize types and ability to use techniques and skills needed for reducing stress and depression.   Education: Stress, Anxiety, and Depression - Group verbal and visual presentation to define topics covered.  Reviews how body is impacted by stress, anxiety, and depression.  Also discusses healthy ways to reduce stress and to treat/manage anxiety and depression.  Written material given at graduation.   Education: Sleep Hygiene -Provides group verbal and written instruction about how sleep  can affect your health.  Define sleep hygiene, discuss sleep cycles and impact of sleep habits. Review good sleep hygiene tips.    Initial Review & Psychosocial Screening:  Initial Psych Review & Screening - 06/07/23 1023       Initial Review   Current issues with Current Stress Concerns    Source of Stress Concerns Financial;Family      Family Dynamics   Good Support System? Yes   wife, daughter lives behind him     Barriers   Psychosocial barriers to participate in program There are no identifiable barriers or psychosocial needs.      Screening Interventions   Interventions To provide support and resources with identified psychosocial needs;Provide feedback about the scores to participant    Expected Outcomes Short Term goal: Utilizing psychosocial counselor, staff and physician to assist with identification of specific Stressors or current issues interfering with healing process. Setting desired goal for each stressor or current issue identified.;Long Term Goal: Stressors or current issues are controlled or eliminated.;Short Term goal:  Identification and review with participant of any Quality of Life or Depression concerns found by scoring the questionnaire.;Long Term goal: The participant improves quality of Life and PHQ9 Scores as seen by post scores and/or verbalization of changes             Quality of Life Scores:   Quality of Life - 06/20/23 1523       Quality of Life   Select Quality of Life      Quality of Life Scores   Health/Function Pre 18.27 %    Socioeconomic Pre 18.86 %    Psych/Spiritual Pre 23.36 %    Family Pre 20.4 %    GLOBAL Pre 19.75 %            Scores of 19 and below usually indicate a poorer quality of life in these areas.  A difference of  2-3 points is a clinically meaningful difference.  A difference of 2-3 points in the total score of the Quality of Life Index has been associated with significant improvement in overall quality of life,  self-image, physical symptoms, and general health in studies assessing change in quality of life.  PHQ-9: Review Flowsheet       06/20/2023  Depression screen PHQ 2/9  Decreased Interest 0  Down, Depressed, Hopeless 0  PHQ - 2 Score 0  Altered sleeping 0  Tired, decreased energy 3  Change in appetite 0  Feeling bad or failure about yourself  0  Trouble concentrating 0  Moving slowly or fidgety/restless 0  Suicidal thoughts 0  PHQ-9 Score 3  Difficult doing work/chores Not difficult at all   Interpretation of Total Score  Total Score Depression Severity:  1-4 = Minimal depression, 5-9 = Mild depression, 10-14 = Moderate depression, 15-19 = Moderately severe depression, 20-27 = Severe depression   Psychosocial Evaluation and Intervention:  Psychosocial Evaluation - 06/07/23 1053       Psychosocial Evaluation & Interventions   Interventions Encouraged to exercise with the program and follow exercise prescription    Comments Nicholas Acevedo has no barriers to attending the program. He wants to increase his stamina and be as healthy as he can. He does have concern with financial and family matters.  His adult son recently had an MI. Nicholas Acevedo will be attending the same CR sessions as his son.  He is ready to start the program.    Expected Outcomes STG Pacer attends all scheduled sessions and is able to decrease his conerns about his son's health. He can find resources to help him with his financial concerns. lTG Nicholas Acevedo is able to cintinue with his exercise progression with his son.    Continue Psychosocial Services  Follow up required by staff             Psychosocial Re-Evaluation:   Psychosocial Discharge (Final Psychosocial Re-Evaluation):   Vocational Rehabilitation: Provide vocational rehab assistance to qualifying candidates.   Vocational Rehab Evaluation & Intervention:   Education: Education Goals: Education classes will be provided on a variety of topics geared toward better  understanding of heart health and risk factor modification. Participant will state understanding/return demonstration of topics presented as noted by education test scores.  Learning Barriers/Preferences:   General Cardiac Education Topics:  AED/CPR: - Group verbal and written instruction with the use of models to demonstrate the basic use of the AED with the basic ABC's of resuscitation.   Anatomy and Cardiac Procedures: - Group verbal and visual presentation and models provide information about  basic cardiac anatomy and function. Reviews the testing methods done to diagnose heart disease and the outcomes of the test results. Describes the treatment choices: Medical Management, Angioplasty, or Coronary Bypass Surgery for treating various heart conditions including Myocardial Infarction, Angina, Valve Disease, and Cardiac Arrhythmias.  Written material given at graduation. Flowsheet Row Cardiac Rehab from 06/22/2023 in [facility] Cardiac and Pulmonary Rehab  Education need identified 06/20/23       Medication Safety: - Group verbal and visual instruction to review commonly prescribed medications for heart and lung disease. Reviews the medication, class of the drug, and side effects. Includes the steps to properly store meds and maintain the prescription regimen.  Written material given at graduation.   Intimacy: - Group verbal instruction through game format to discuss how heart and lung disease can affect sexual intimacy. Written material given at graduation..   Know Your Numbers and Heart Failure: - Group verbal and visual instruction to discuss disease risk factors for cardiac and pulmonary disease and treatment options.  Reviews associated critical values for Overweight/Obesity, Hypertension, Cholesterol, and Diabetes.  Discusses basics of heart failure: signs/symptoms and treatments.  Introduces Heart Failure Zone chart for action plan for heart failure.  Written material given at  graduation.   Infection Prevention: - Provides verbal and written material to individual with discussion of infection control including proper hand washing and proper equipment cleaning during exercise session. Flowsheet Row Cardiac Rehab from 06/22/2023 in [facility] Cardiac and Pulmonary Rehab  Date 06/20/23  Educator NT  Instruction Review Code 1- Verbalizes Understanding       Falls Prevention: - Provides verbal and written material to individual with discussion of falls prevention and safety. Flowsheet Row Cardiac Rehab from 06/22/2023 in [facility] Cardiac and Pulmonary Rehab  Date 06/07/23  Educator SB  Instruction Review Code 1- Verbalizes Understanding       Other: -Provides group and verbal instruction on various topics (see comments) Flowsheet Row Cardiac Rehab from 06/22/2023 in [facility] Cardiac and Pulmonary Rehab  Date 06/22/23  Educator Jeopardy SB  Instruction Review Code 1- Verbalizes Understanding       Knowledge Questionnaire Score:  Knowledge Questionnaire Score - 06/20/23 1524       Knowledge Questionnaire Score   Pre Score 21/26             Core Components/Risk Factors/Patient Goals at Admission:  Personal Goals and Risk Factors at Admission - 06/07/23 1025       Core Components/Risk Factors/Patient Goals on Admission   Intervention Weight Management: Develop a combined nutrition and exercise program designed to reach desired caloric intake, while maintaining appropriate intake of nutrient and fiber, sodium and fats, and appropriate energy expenditure required for the weight goal.;Weight Management: Provide education and appropriate resources to help participant work on and attain dietary goals.    Admit Weight 260 lb (117.9 kg)    Goal Weight: Short Term 258 lb (117 kg)    Goal Weight: Long Term 220 lb (99.8 kg)    Expected Outcomes Short Term: Continue to assess and modify interventions until short term weight is achieved;Long Term: Adherence to nutrition  and physical activity/exercise program aimed toward attainment of established weight goal;Weight Loss: Understanding of general recommendations for a balanced deficit meal plan, which promotes 1-2 lb weight loss per week and includes a negative energy balance of [phone] kcal/d    Diabetes Yes    Intervention Provide education about signs/symptoms and action to take for hypo/hyperglycemia.;Provide education about proper nutrition, including hydration,  and aerobic/resistive exercise prescription along with prescribed medications to achieve blood glucose in normal ranges: Fasting glucose 65-99 mg/dL    Expected Outcomes Short Term: Participant verbalizes understanding of the signs/symptoms and immediate care of hyper/hypoglycemia, proper foot care and importance of medication, aerobic/resistive exercise and nutrition plan for blood glucose control.;Long Term: Attainment of HbA1C < 7%.    Hypertension Yes    Intervention Provide education on lifestyle modifcations including regular physical activity/exercise, weight management, moderate sodium restriction and increased consumption of fresh fruit, vegetables, and low fat dairy, alcohol moderation, and smoking cessation.;Monitor prescription use compliance.    Expected Outcomes Short Term: Continued assessment and intervention until BP is < 140/29mm HG in hypertensive participants. < 130/71mm HG in hypertensive participants with diabetes, heart failure or chronic kidney disease.;Long Term: Maintenance of blood pressure at goal levels.    Lipids Yes    Intervention Provide education and support for participant on nutrition & aerobic/resistive exercise along with prescribed medications to achieve LDL 70mg , HDL >40mg .    Expected Outcomes Short Term: Participant states understanding of desired cholesterol values and is compliant with medications prescribed. Participant is following exercise prescription and nutrition guidelines.;Long Term: Cholesterol controlled  with medications as prescribed, with individualized exercise RX and with personalized nutrition plan. Value goals: LDL < 70mg , HDL > 40 mg.             Education:Diabetes - Individual verbal and written instruction to review signs/symptoms of diabetes, desired ranges of glucose level fasting, after meals and with exercise. Acknowledge that pre and post exercise glucose checks will be done for 3 sessions at entry of program.   Core Components/Risk Factors/Patient Goals Review:    Core Components/Risk Factors/Patient Goals at Discharge (Final Review):    ITP Comments:  ITP Comments     Row Name 06/07/23 1051 06/20/23 1238 06/22/23 1027 07/04/23 1400     ITP Comments Virtual orientation call completed today. he has an appointment on Date: 06/20/2023  for EP eval and gym Orientation.  Documentation of diagnosis can be found in [facility] Date: 06/05/2023 . Completed and gym orientation. Initial ITP created and sent for review to Dr. Bethann Punches, Medical Director. First full day of exercise!  Patient was oriented to gym and equipment including functions, settings, policies, and procedures.  Patient's individual exercise prescription and treatment plan were reviewed.  All starting workloads were established based on the results of the 6 minute walk test done at initial orientation visit.  The plan for exercise progression was also introduced and progression will be customized based on patient's performance and goals. 30 Day review completed. Medical Director ITP review done, changes made as directed, and signed approval by Medical Director.   new to program             Comments:

## 2023-07-04 NOTE — Progress Notes (Signed)
Daily Session Note  Patient Details  Name: Nicholas Acevedo MRN: [id number] Date of Birth: Aug 17, 1946 Referring Provider:   Flowsheet Row Cardiac Rehab from 06/20/2023 in [facility] Cardiac and Pulmonary Rehab  Referring Provider Dr. Massie Maroon, MD       Encounter Date: 07/04/2023  Check In:  Session Check In - 07/04/23 1046       Check-In   Supervising physician immediately available to respond to emergencies See telemetry face sheet for immediately available ER MD    Location [facility]-Cardiac & Pulmonary Rehab    Staff Present Cora Collum, RN, BSN, CCRP;Meredith Jewel Baize, RN Mabeline Caras, BS, ACSM CEP, Exercise Physiologist    Virtual Visit No    Medication changes reported     No    Fall or balance concerns reported    No    Warm-up and Cool-down Performed on first and last piece of equipment    Resistance Training Performed Yes    VAD Patient? No    PAD/SET Patient? No      Pain Assessment   Currently in Pain? No/denies                Social History   Tobacco Use  Smoking Status Never  Smokeless Tobacco Never    Goals Met:  Independence with exercise equipment Exercise tolerated well No report of concerns or symptoms today  Goals Unmet:  Not Applicable  Comments: Pt able to follow exercise prescription today without complaint.  Will continue to monitor for progression.    Dr. Bethann Punches is Medical Director for [facility] Cardiac Rehabilitation.  Dr. Vida Rigger is Medical Director for [facility] Pulmonary Rehabilitation.

## 2023-07-05 DIAGNOSIS — I2510 Atherosclerotic heart disease of native coronary artery without angina pectoris: Secondary | ICD-10-CM | POA: Diagnosis not present

## 2023-07-05 DIAGNOSIS — Z6835 Body mass index (BMI) 35.0-35.9, adult: Secondary | ICD-10-CM | POA: Diagnosis not present

## 2023-07-05 DIAGNOSIS — E669 Obesity, unspecified: Secondary | ICD-10-CM | POA: Diagnosis not present

## 2023-07-05 DIAGNOSIS — E7849 Other hyperlipidemia: Secondary | ICD-10-CM | POA: Diagnosis not present

## 2023-07-05 DIAGNOSIS — I440 Atrioventricular block, first degree: Secondary | ICD-10-CM | POA: Diagnosis not present

## 2023-07-05 DIAGNOSIS — Z794 Long term (current) use of insulin: Secondary | ICD-10-CM | POA: Diagnosis not present

## 2023-07-05 DIAGNOSIS — Z955 Presence of coronary angioplasty implant and graft: Secondary | ICD-10-CM | POA: Diagnosis not present

## 2023-07-05 DIAGNOSIS — E119 Type 2 diabetes mellitus without complications: Secondary | ICD-10-CM | POA: Diagnosis not present

## 2023-07-05 DIAGNOSIS — I10 Essential (primary) hypertension: Secondary | ICD-10-CM | POA: Diagnosis not present

## 2023-07-06 ENCOUNTER — Encounter: Payer: Medicare PPO | Admitting: *Deleted

## 2023-07-06 DIAGNOSIS — Z48812 Encounter for surgical aftercare following surgery on the circulatory system: Secondary | ICD-10-CM | POA: Diagnosis not present

## 2023-07-06 DIAGNOSIS — E119 Type 2 diabetes mellitus without complications: Secondary | ICD-10-CM | POA: Diagnosis not present

## 2023-07-06 DIAGNOSIS — Z955 Presence of coronary angioplasty implant and graft: Secondary | ICD-10-CM | POA: Diagnosis not present

## 2023-07-06 LAB — GLUCOSE, CAPILLARY: Glucose-Capillary: 208 mg/dL — ABNORMAL HIGH (ref 70–99)

## 2023-07-06 NOTE — Progress Notes (Signed)
Daily Session Note  Patient Details  Name: Nicholas Acevedo MRN: [id number] Date of Birth: 23-Oct-1946 Referring Provider:   Flowsheet Row Cardiac Rehab from 06/20/2023 in [facility] Cardiac and Pulmonary Rehab  Referring Provider Dr. Massie Maroon, MD       Encounter Date: 07/06/2023  Check In:  Session Check In - 07/06/23 0958       Check-In   Supervising physician immediately available to respond to emergencies See telemetry face sheet for immediately available ER MD    Location [facility]-Cardiac & Pulmonary Rehab    Staff Present Lanny Hurst, RN, ADN;Joseph [place], RCP,RRT,BSRT;Other   Rory Percy, MS   Virtual Visit No    Medication changes reported     No    Fall or balance concerns reported    No    Warm-up and Cool-down Performed on first and last piece of equipment    Resistance Training Performed Yes    VAD Patient? No    PAD/SET Patient? No      Pain Assessment   Currently in Pain? No/denies                Social History   Tobacco Use  Smoking Status Never  Smokeless Tobacco Never    Goals Met:  Independence with exercise equipment Exercise tolerated well No report of concerns or symptoms today Strength training completed today  Goals Unmet:  Not Applicable  Comments: Pt able to follow exercise prescription today without complaint.  Will continue to monitor for progression.    Dr. Bethann Punches is Medical Director for [facility] Cardiac Rehabilitation.  Dr. Vida Rigger is Medical Director for [facility] Pulmonary Rehabilitation.

## 2023-07-11 ENCOUNTER — Encounter: Payer: Medicare PPO | Admitting: *Deleted

## 2023-07-11 DIAGNOSIS — Z955 Presence of coronary angioplasty implant and graft: Secondary | ICD-10-CM | POA: Diagnosis not present

## 2023-07-11 DIAGNOSIS — Z48812 Encounter for surgical aftercare following surgery on the circulatory system: Secondary | ICD-10-CM | POA: Diagnosis not present

## 2023-07-11 DIAGNOSIS — E119 Type 2 diabetes mellitus without complications: Secondary | ICD-10-CM | POA: Diagnosis not present

## 2023-07-11 NOTE — Progress Notes (Signed)
Daily Session Note  Patient Details  Name: Nicholas Acevedo MRN: [id number] Date of Birth: [birth year] Referring Provider:   Flowsheet Row Cardiac Rehab from 06/20/2023 in [facility] Cardiac and Pulmonary Rehab  Referring Provider Dr. Massie Maroon, MD       Encounter Date: 07/11/2023  Check In:  Session Check In - 07/11/23 1029       Check-In   Supervising physician immediately available to respond to emergencies See telemetry face sheet for immediately available ER MD    Location [facility]-Cardiac & Pulmonary Rehab    Staff Present Tommye Standard, BS, ACSM CEP, Exercise Physiologist;Susanne Bice, RN, BSN, CCRP;Andera Cranmer Jewel Baize, RN BSN    Virtual Visit No    Medication changes reported     No    Fall or balance concerns reported    No    Warm-up and Cool-down Performed on first and last piece of equipment    Resistance Training Performed Yes    VAD Patient? No    PAD/SET Patient? No      Pain Assessment   Currently in Pain? No/denies                Social History   Tobacco Use  Smoking Status Never  Smokeless Tobacco Never    Goals Met:  Independence with exercise equipment Exercise tolerated well No report of concerns or symptoms today Strength training completed today  Goals Unmet:  Not Applicable  Comments: Pt able to follow exercise prescription today without complaint.  Will continue to monitor for progression.    Dr. Bethann Punches is Medical Director for [facility] Cardiac Rehabilitation.  Dr. Vida Rigger is Medical Director for [facility] Pulmonary Rehabilitation.

## 2023-07-13 ENCOUNTER — Encounter: Payer: Medicare PPO | Admitting: *Deleted

## 2023-07-13 DIAGNOSIS — Z955 Presence of coronary angioplasty implant and graft: Secondary | ICD-10-CM

## 2023-07-13 DIAGNOSIS — E119 Type 2 diabetes mellitus without complications: Secondary | ICD-10-CM | POA: Diagnosis not present

## 2023-07-13 DIAGNOSIS — Z48812 Encounter for surgical aftercare following surgery on the circulatory system: Secondary | ICD-10-CM | POA: Diagnosis not present

## 2023-07-13 NOTE — Progress Notes (Signed)
Daily Session Note  Patient Details  Name: Nicholas Acevedo MRN: [id number] Date of Birth: 1946-09-25 Referring Provider:   Flowsheet Row Cardiac Rehab from 06/20/2023 in [facility] Cardiac and Pulmonary Rehab  Referring Provider Dr. Massie Maroon, MD       Encounter Date: 07/13/2023  Check In:  Session Check In - 07/13/23 1135       Check-In   Supervising physician immediately available to respond to emergencies See telemetry face sheet for immediately available ER MD    Location [facility]-Cardiac & Pulmonary Rehab    Staff Present Lanny Hurst, RN, ADN;Joseph Hood, RCP,RRT,BSRT;Betsy Coder, PhD, RN, CNS, CEN    Virtual Visit No    Medication changes reported     No    Fall or balance concerns reported    No    Warm-up and Cool-down Performed on first and last piece of equipment    Resistance Training Performed Yes    VAD Patient? No    PAD/SET Patient? No      Pain Assessment   Currently in Pain? No/denies                Social History   Tobacco Use  Smoking Status Never  Smokeless Tobacco Never    Goals Met:  Independence with exercise equipment Exercise tolerated well No report of concerns or symptoms today Strength training completed today  Goals Unmet:  Not Applicable  Comments: Pt able to follow exercise prescription today without complaint.  Will continue to monitor for progression.    Dr. Bethann Punches is Medical Director for [facility] Cardiac Rehabilitation.  Dr. Vida Rigger is Medical Director for [facility] Pulmonary Rehabilitation.

## 2023-07-13 NOTE — Progress Notes (Signed)
Assessment: Comorbidities T2DM  24-hours Recall: B: cheese egg, bacon or sausage, water L: leftover - from dinner D: fried chicken, mushrooms, potato   Beverages water, green tea, juice, stopped drinking sweet tea 5 weeks ago  Education r/t nutrition plan Patient drinking more water now, encouraged him to continue to do so. He cut out sweet tea ~5weeks ago. Commended him on this change, encouraged him to watch is juice intake as it similar to sweet tea/soda. He says he mostly has it when his sugar drops low. He has a monitor on his arm and reports it has been dropping low throughout the day as of late. Talked to him about eating complex carbs and pairing them with protein/fat. Provided some examples like apple and peanut butter. Reviewed mediterranean diet handout, encouraged smaller portions, reducing sodium/sat fat content, eating less fried processed foods.   Goal 1: Watch carb sources/portions at meals Goal 2: reduce sodium content

## 2023-07-18 ENCOUNTER — Encounter: Payer: Medicare PPO | Admitting: *Deleted

## 2023-07-18 DIAGNOSIS — Z955 Presence of coronary angioplasty implant and graft: Secondary | ICD-10-CM | POA: Diagnosis not present

## 2023-07-18 DIAGNOSIS — E119 Type 2 diabetes mellitus without complications: Secondary | ICD-10-CM | POA: Diagnosis not present

## 2023-07-18 DIAGNOSIS — Z48812 Encounter for surgical aftercare following surgery on the circulatory system: Secondary | ICD-10-CM | POA: Diagnosis not present

## 2023-07-18 NOTE — Progress Notes (Signed)
Daily Session Note  Patient Details  Name: NYKOLAS BACALLAO MRN: [id number] Date of Birth: 1946/05/02 Referring Provider:   Flowsheet Row Cardiac Rehab from 06/20/2023 in [facility] Cardiac and Pulmonary Rehab  Referring Provider Dr. Massie Maroon, MD       Encounter Date: 07/18/2023  Check In:  Session Check In - 07/18/23 1041       Check-In   Supervising physician immediately available to respond to emergencies See telemetry face sheet for immediately available ER MD    Location [facility]-Cardiac & Pulmonary Rehab    Staff Present Cora Collum, RN, BSN, CCRP;Meredith Jewel Baize, RN BSN;Other   Rory Percy MS, Exercies Physiologist; Maxon Conetta BS Exercise Physiologist   Virtual Visit No    Medication changes reported     Yes    Comments holding Monjuro and insulin. Blood sugars have been low.    Fall or balance concerns reported    No    Warm-up and Cool-down Performed on first and last piece of equipment    Resistance Training Performed Yes    VAD Patient? No    PAD/SET Patient? No      Pain Assessment   Currently in Pain? No/denies                Social History   Tobacco Use  Smoking Status Never  Smokeless Tobacco Never    Goals Met:  Independence with exercise equipment Exercise tolerated well No report of concerns or symptoms today  Goals Unmet:  Not Applicable  Comments: Pt able to follow exercise prescription today without complaint.  Will continue to monitor for progression.    Dr. Bethann Punches is Medical Director for [facility] Cardiac Rehabilitation.  Dr. Vida Rigger is Medical Director for [facility] Pulmonary Rehabilitation.

## 2023-07-20 ENCOUNTER — Encounter: Payer: Medicare PPO | Admitting: *Deleted

## 2023-07-20 DIAGNOSIS — Z48812 Encounter for surgical aftercare following surgery on the circulatory system: Secondary | ICD-10-CM | POA: Diagnosis not present

## 2023-07-20 DIAGNOSIS — Z955 Presence of coronary angioplasty implant and graft: Secondary | ICD-10-CM | POA: Diagnosis not present

## 2023-07-20 DIAGNOSIS — E119 Type 2 diabetes mellitus without complications: Secondary | ICD-10-CM | POA: Diagnosis not present

## 2023-07-20 NOTE — Progress Notes (Signed)
Daily Session Note  Patient Details  Name: Nicholas Acevedo MRN: [id number] Date of Birth: 12/25/1946 Referring Provider:   Flowsheet Row Cardiac Rehab from 06/20/2023 in [facility] Cardiac and Pulmonary Rehab  Referring Provider Dr. Massie Maroon, MD       Encounter Date: 07/20/2023  Check In:  Session Check In - 07/20/23 1114       Check-In   Supervising physician immediately available to respond to emergencies See telemetry face sheet for immediately available ER MD    Location [facility]-Cardiac & Pulmonary Rehab    Staff Present Lanny Hurst, RN, ADN;Other   Rory Percy, MS / Jetta Lout, BS   Virtual Visit No    Medication changes reported     No    Fall or balance concerns reported    No    Warm-up and Cool-down Performed on first and last piece of equipment    Resistance Training Performed Yes    VAD Patient? No    PAD/SET Patient? No      Pain Assessment   Currently in Pain? No/denies                Social History   Tobacco Use  Smoking Status Never  Smokeless Tobacco Never    Goals Met:  Independence with exercise equipment Exercise tolerated well No report of concerns or symptoms today Strength training completed today  Goals Unmet:  Not Applicable  Comments: Pt able to follow exercise prescription today without complaint.  Will continue to monitor for progression.    Dr. Bethann Punches is Medical Director for [facility] Cardiac Rehabilitation.  Dr. Vida Rigger is Medical Director for [facility] Pulmonary Rehabilitation.

## 2023-07-25 ENCOUNTER — Encounter: Payer: Medicare PPO | Admitting: *Deleted

## 2023-07-25 DIAGNOSIS — Z48812 Encounter for surgical aftercare following surgery on the circulatory system: Secondary | ICD-10-CM | POA: Diagnosis not present

## 2023-07-25 DIAGNOSIS — Z955 Presence of coronary angioplasty implant and graft: Secondary | ICD-10-CM | POA: Diagnosis not present

## 2023-07-25 DIAGNOSIS — E119 Type 2 diabetes mellitus without complications: Secondary | ICD-10-CM | POA: Diagnosis not present

## 2023-07-25 DIAGNOSIS — E1142 Type 2 diabetes mellitus with diabetic polyneuropathy: Secondary | ICD-10-CM | POA: Diagnosis not present

## 2023-07-25 NOTE — Progress Notes (Signed)
Daily Session Note  Patient Details  Name: Nicholas Acevedo MRN: [id number] Date of Birth: Feb 10, 1946 Referring Provider:   Flowsheet Row Cardiac Rehab from 06/20/2023 in [facility] Cardiac and Pulmonary Rehab  Referring Provider Dr. Massie Maroon, MD       Encounter Date: 07/25/2023  Check In:  Session Check In - 07/25/23 1028       Check-In   Supervising physician immediately available to respond to emergencies See telemetry face sheet for immediately available ER MD    Location [facility]-Cardiac & Pulmonary Rehab    Staff Present Cora Collum, RN, BSN, CCRP;Meredith Jewel Baize, RN BSN   Rory Percy MS Exercise physiologist, Maxon Conetta BS Exercise Physiologist   Virtual Visit No    Medication changes reported     Yes    Comments stopped MOnjuro and resumed insulin    Fall or balance concerns reported    No    Warm-up and Cool-down Performed on first and last piece of equipment    Resistance Training Performed Yes    VAD Patient? No    PAD/SET Patient? No      Pain Assessment   Currently in Pain? No/denies                Social History   Tobacco Use  Smoking Status Never  Smokeless Tobacco Never    Goals Met:  Independence with exercise equipment Exercise tolerated well No report of concerns or symptoms today  Goals Unmet:  Not Applicable  Comments: Pt able to follow exercise prescription today without complaint.  Will continue to monitor for progression.    Dr. Bethann Punches is Medical Director for [facility] Cardiac Rehabilitation.  Dr. Vida Rigger is Medical Director for [facility] Pulmonary Rehabilitation.

## 2023-07-27 ENCOUNTER — Encounter: Payer: Medicare PPO | Attending: Cardiology | Admitting: *Deleted

## 2023-07-27 DIAGNOSIS — Z955 Presence of coronary angioplasty implant and graft: Secondary | ICD-10-CM | POA: Diagnosis not present

## 2023-07-27 DIAGNOSIS — Z48812 Encounter for surgical aftercare following surgery on the circulatory system: Secondary | ICD-10-CM | POA: Insufficient documentation

## 2023-07-27 NOTE — Progress Notes (Signed)
Daily Session Note  Patient Details  Name: Nicholas Acevedo MRN: [id number] Date of Birth: 1946-06-10 Referring Provider:   Flowsheet Row Cardiac Rehab from 06/20/2023 in [facility] Cardiac and Pulmonary Rehab  Referring Provider Dr. Massie Maroon, MD       Encounter Date: 07/27/2023  Check In:  Session Check In - 07/27/23 1005       Check-In   Supervising physician immediately available to respond to emergencies See telemetry face sheet for immediately available ER MD    Location [facility]-Cardiac & Pulmonary Rehab    Staff Present Lanny Hurst, RN, ADN;Joseph Hood, RCP,RRT,BSRT;Other   Max Madrid, EP BS / Girtha Rm, EP MS   Virtual Visit No    Medication changes reported     No    Fall or balance concerns reported    No    Warm-up and Cool-down Performed on first and last piece of equipment    Resistance Training Performed Yes    VAD Patient? No    PAD/SET Patient? No      Pain Assessment   Currently in Pain? No/denies                Social History   Tobacco Use  Smoking Status Never  Smokeless Tobacco Never    Goals Met:  Independence with exercise equipment Exercise tolerated well No report of concerns or symptoms today Strength training completed today  Goals Unmet:  Not Applicable  Comments: Pt able to follow exercise prescription today without complaint.  Will continue to monitor for progression.    Dr. Bethann Punches is Medical Director for [facility] Cardiac Rehabilitation.  Dr. Vida Rigger is Medical Director for [facility] Pulmonary Rehabilitation.

## 2023-07-28 DIAGNOSIS — E1169 Type 2 diabetes mellitus with other specified complication: Secondary | ICD-10-CM | POA: Diagnosis not present

## 2023-07-28 DIAGNOSIS — E785 Hyperlipidemia, unspecified: Secondary | ICD-10-CM | POA: Diagnosis not present

## 2023-07-28 DIAGNOSIS — E1159 Type 2 diabetes mellitus with other circulatory complications: Secondary | ICD-10-CM | POA: Diagnosis not present

## 2023-07-28 DIAGNOSIS — E1142 Type 2 diabetes mellitus with diabetic polyneuropathy: Secondary | ICD-10-CM | POA: Diagnosis not present

## 2023-07-28 DIAGNOSIS — I152 Hypertension secondary to endocrine disorders: Secondary | ICD-10-CM | POA: Diagnosis not present

## 2023-07-31 DIAGNOSIS — E1165 Type 2 diabetes mellitus with hyperglycemia: Secondary | ICD-10-CM | POA: Diagnosis not present

## 2023-08-01 ENCOUNTER — Encounter: Payer: Medicare PPO | Admitting: *Deleted

## 2023-08-01 DIAGNOSIS — Z955 Presence of coronary angioplasty implant and graft: Secondary | ICD-10-CM | POA: Diagnosis not present

## 2023-08-01 DIAGNOSIS — Z48812 Encounter for surgical aftercare following surgery on the circulatory system: Secondary | ICD-10-CM | POA: Diagnosis not present

## 2023-08-01 NOTE — Progress Notes (Signed)
Daily Session Note  Patient Details  Name: Nicholas Acevedo MRN: [id number] Date of Birth: 03-17-1946 Referring Provider:   Flowsheet Row Cardiac Rehab from 06/20/2023 in [facility] Cardiac and Pulmonary Rehab  Referring Provider Dr. Massie Maroon, MD       Encounter Date: 08/01/2023  Check In:  Session Check In - 08/01/23 1049       Check-In   Supervising physician immediately available to respond to emergencies See telemetry face sheet for immediately available ER MD    Location [facility]-Cardiac & Pulmonary Rehab    Staff Present Cora Collum, RN, BSN, CCRP;Meredith Jewel Baize, RN BSN;Other   Maxon [employer], Exercise Physiologist, Rory Percy MS, Exercise Physiologist   Virtual Visit No    Medication changes reported     Yes    Comments added JArdiance 12.5 mg   using less insulin    Fall or balance concerns reported    No    Warm-up and Cool-down Performed on first and last piece of equipment    Resistance Training Performed Yes    VAD Patient? No    PAD/SET Patient? No      Pain Assessment   Currently in Pain? No/denies                Social History   Tobacco Use  Smoking Status Never  Smokeless Tobacco Never    Goals Met:  Independence with exercise equipment Exercise tolerated well No report of concerns or symptoms today  Goals Unmet:  Not Applicable  Comments: Pt able to follow exercise prescription today without complaint.  Will continue to monitor for progression.    Dr. Bethann Punches is Medical Director for [facility] Cardiac Rehabilitation.  Dr. Vida Rigger is Medical Director for [facility] Pulmonary Rehabilitation.

## 2023-08-02 ENCOUNTER — Encounter: Payer: Self-pay | Admitting: *Deleted

## 2023-08-02 DIAGNOSIS — Z955 Presence of coronary angioplasty implant and graft: Secondary | ICD-10-CM

## 2023-08-02 NOTE — Progress Notes (Signed)
Cardiac Individual Treatment Plan  Patient Details  Name: Nicholas Acevedo MRN: [id number] Date of Birth: Aug 08, 1946 Referring Provider:   Flowsheet Row Cardiac Rehab from 06/20/2023 in [facility] Cardiac and Pulmonary Rehab  Referring Provider Dr. Massie Maroon, MD       Initial Encounter Date:  Flowsheet Row Cardiac Rehab from 06/20/2023 in [facility] Cardiac and Pulmonary Rehab  Date 06/20/23       Visit Diagnosis: Status post coronary artery stent placement  Patient's Home Medications on Admission:  Current Outpatient Medications:    ascorbic acid (VITAMIN C) 500 MG tablet, Take by mouth. (Patient not taking: Reported on 06/07/2023), Disp: , Rfl:    aspirin EC 81 MG tablet, Take 81 mg by mouth daily., Disp: , Rfl:    atorvastatin (LIPITOR) 40 MG tablet, Take 40 mg by mouth daily., Disp: , Rfl:    brimonidine (ALPHAGAN) 0.2 % ophthalmic solution, Place 1 drop into both eyes 3 (three) times daily., Disp: , Rfl:    Cholecalciferol 50 MCG (2000 UT) CAPS, Take by mouth. (Patient not taking: Reported on 06/07/2023), Disp: , Rfl:    clopidogrel (PLAVIX) 75 MG tablet, Take 75 mg by mouth daily., Disp: , Rfl:    cyanocobalamin 1000 MCG tablet, Take by mouth. (Patient not taking: Reported on 06/07/2023), Disp: , Rfl:    dorzolamide-timolol (COSOPT) 22.3-6.8 MG/ML ophthalmic solution, , Disp: , Rfl:    Dulaglutide 0.75 MG/0.5ML SOPN, Inject into the skin. (Patient not taking: Reported on 06/07/2023), Disp: , Rfl:    empagliflozin (JARDIANCE) 25 MG TABS tablet, Take 25 mg by mouth daily. 25 mg tab take half a tab (12.5mg )  daily, Disp: , Rfl:    glipiZIDE (GLUCOTROL) 5 MG tablet, TAKE 1 TABLET BY MOUTH TWICE DAILY BEFORE MEAL(S), Disp: , Rfl:    hydrocortisone 2.5 % cream, Use as directed. Instructions in handout, Disp: 30 g, Rfl: 3   LANTUS SOLOSTAR 100 UNIT/ML Solostar Pen, Inject 45 Units into the skin daily. AM, Disp: , Rfl:    latanoprost (XALATAN) 0.005 % ophthalmic solution, 1 drop at bedtime. (Patient  not taking: Reported on 06/07/2023), Disp: , Rfl:    losartan (COZAAR) 50 MG tablet, Take 50 mg by mouth daily., Disp: , Rfl:    metFORMIN (GLUCOPHAGE) 1000 MG tablet, Take 1,000 mg by mouth 2 (two) times daily with a meal., Disp: , Rfl:    MOUNJARO 5 MG/0.5ML Pen, Inject 5 mg into the skin once a week. (Patient not taking: Reported on 07/18/2023), Disp: , Rfl:    Netarsudil-Latanoprost (ROCKLATAN) 0.02-0.005 % SOLN, 1 drop nightly, Disp: , Rfl:    omeprazole (PRILOSEC OTC) 20 MG tablet, Take by mouth., Disp: , Rfl:    omeprazole (PRILOSEC) 40 MG capsule, Take 40 mg by mouth daily. (Patient not taking: Reported on 06/07/2023), Disp: , Rfl:    ONE TOUCH ULTRA TEST test strip, USE ONE STRIP TO CHECK GLUCOSE ONCE DAILY, Disp: , Rfl: 3   sildenafil (REVATIO) 20 MG tablet, 2-5 tabs 1 hour prior to intercourse (Patient not taking: Reported on 06/07/2023), Disp: 15 tablet, Rfl: 0   tamsulosin (FLOMAX) 0.4 MG CAPS capsule, Take 1 capsule (0.4 mg total) by mouth daily., Disp: 90 capsule, Rfl: 3  Past Medical History: Past Medical History:  Diagnosis Date   Actinic keratosis 10/31/2007   R forearm - bx proven    Basal cell carcinoma 03/15/2007   L alar groove    Basal cell carcinoma 08/20/2018   L upper med clavicle  BCC (basal cell carcinoma of skin) 11/23/2021   Left lower cheek above jaw, EDC   Cancer (HCC)    prostate   Diabetes mellitus without complication (HCC)    ED (erectile dysfunction)    Esophagitis    GERD (gastroesophageal reflux disease)    Glaucoma    Hyperlipemia    Hypertension    Impotence of organic origin    Sleep apnea    Urinary obstruction     Tobacco Use: Social History   Tobacco Use  Smoking Status Never  Smokeless Tobacco Never    Labs: Review Flowsheet        No data to display           Exercise Target Goals: Exercise Program Goal: Individual exercise prescription set using results from initial 6 min walk test and THRR while considering   patient's activity barriers and safety.   Exercise Prescription Goal: Initial exercise prescription builds to 30-45 minutes a day of aerobic activity, 2-3 days per week.  Home exercise guidelines will be given to patient during program as part of exercise prescription that the participant will acknowledge.   Education: Aerobic Exercise: - Group verbal and visual presentation on the components of exercise prescription. Introduces F.I.T.T principle from ACSM for exercise prescriptions.  Reviews F.I.T.T. principles of aerobic exercise including progression. Written material given at graduation. Flowsheet Row Cardiac Rehab from 07/27/2023 in [facility] Cardiac and Pulmonary Rehab  Education need identified 06/20/23       Education: Resistance Exercise: - Group verbal and visual presentation on the components of exercise prescription. Introduces F.I.T.T principle from ACSM for exercise prescriptions  Reviews F.I.T.T. principles of resistance exercise including progression. Written material given at graduation.    Education: Exercise & Equipment Safety: - Individual verbal instruction and demonstration of equipment use and safety with use of the equipment. Flowsheet Row Cardiac Rehab from 07/27/2023 in [facility] Cardiac and Pulmonary Rehab  Date 06/20/23  Educator NT  Instruction Review Code 1- Verbalizes Understanding       Education: Exercise Physiology & General Exercise Guidelines: - Group verbal and written instruction with models to review the exercise physiology of the cardiovascular system and associated critical values. Provides general exercise guidelines with specific guidelines to those with heart or lung disease.    Education: Flexibility, Balance, Mind/Body Relaxation: - Group verbal and visual presentation with interactive activity on the components of exercise prescription. Introduces F.I.T.T principle from ACSM for exercise prescriptions. Reviews F.I.T.T. principles of flexibility and  balance exercise training including progression. Also discusses the mind body connection.  Reviews various relaxation techniques to help reduce and manage stress (i.e. Deep breathing, progressive muscle relaxation, and visualization). Balance handout provided to take home. Written material given at graduation.   Activity Barriers & Risk Stratification:  Activity Barriers & Cardiac Risk Stratification - 06/20/23 1506       Activity Barriers & Cardiac Risk Stratification   Activity Barriers Other (comment)    Comments Glaucoma impedes vision    Cardiac Risk Stratification Moderate             6 Minute Walk:  6 Minute Walk     Row Name 06/20/23 1505         6 Minute Walk   Phase Initial     Distance 1130 feet     Walk Time 6 minutes     # of Rest Breaks 0     MPH 2.14     METS 1.92  RPE 11     Perceived Dyspnea  1     VO2 Peak 6.71     Symptoms No     Resting HR 71 bpm     Resting BP 126/74     Resting Oxygen Saturation  97 %     Exercise Oxygen Saturation  during 6 min walk 95 %     Max Ex. HR 104 bpm     Max Ex. BP 144/68     2 Minute Post BP 136/74              Oxygen Initial Assessment:   Oxygen Re-Evaluation:   Oxygen Discharge (Final Oxygen Re-Evaluation):   Initial Exercise Prescription:  Initial Exercise Prescription - 06/20/23 1500       Date of Initial Exercise RX and Referring Provider   Date 06/20/23    Referring Provider Dr. Massie Maroon, MD      Oxygen   Maintain Oxygen Saturation 88% or higher      Recumbant Bike   Level 1    RPM 50    Watts 15    Minutes 15    METs 1.92      NuStep   Level 2    SPM 80    Minutes 15    METs 1.92      Biostep-RELP   Level 1    SPM 50    Minutes 15    METs 1.92      Prescription Details   Frequency (times per week) 2    Duration Progress to 30 minutes of continuous aerobic without signs/symptoms of physical distress      Intensity   THRR 40-80% of Max Heartrate 99-128    Ratings  of Perceived Exertion 11-13    Perceived Dyspnea 0-4      Progression   Progression Continue to progress workloads to maintain intensity without signs/symptoms of physical distress.      Resistance Training   Training Prescription Yes    Weight 4 lb    Reps 10-15             Perform Capillary Blood Glucose checks as needed.  Exercise Prescription Changes:   Exercise Prescription Changes     Row Name 06/20/23 1500 06/22/23 1000 07/19/23 0700         Response to Exercise   Blood Pressure (Admit) 126/74 126/74 122/78     Blood Pressure (Exercise) 144/68 144/68 168/80     Blood Pressure (Exit) 136/74 136/74 140/80     Heart Rate (Admit) 71 bpm 71 bpm 64 bpm     Heart Rate (Exercise) 104 bpm 104 bpm 118 bpm     Heart Rate (Exit) 87 bpm 87 bpm 97 bpm     Oxygen Saturation (Admit) 97 % 97 % --     Oxygen Saturation (Exercise) 95 % 95 % --     Rating of Perceived Exertion (Exercise) 11 11 13      Perceived Dyspnea (Exercise) 1 1 --     Symptoms None None --     Comments Results Results --       Progression   Average METs -- -- 3.23       Resistance Training   Training Prescription -- Yes Yes     Weight -- 4 lb 4 lb     Reps -- 10-15 10-15       Recumbant Bike   Level -- 1 1     RPM --  50 --     Watts -- 15 15     Minutes -- 15 15     METs -- 1.92 2.4       NuStep   Level -- 2 3     SPM -- 80 --     Minutes -- 15 15     METs -- 1.92 3.6       Biostep-RELP   Level -- 1 3     SPM -- 50 --     Minutes -- 15 15     METs -- 1.92 3       Oxygen   Maintain Oxygen Saturation -- -- 88% or higher              Exercise Comments:   Exercise Comments     Row Name 06/22/23 1028           Exercise Comments First full day of exercise!  Patient was oriented to gym and equipment including functions, settings, policies, and procedures.  Patient's individual exercise prescription and treatment plan were reviewed.  All starting workloads were  established based on the results of the 6 minute walk test done at initial orientation visit.  The plan for exercise progression was also introduced and progression will be customized based on patient's performance and goals.                Exercise Goals and Review:   Exercise Goals     Row Name 06/20/23 1239             Exercise Goals   Increase Physical Activity Yes       Intervention Develop an individualized exercise prescription for aerobic and resistive training based on initial evaluation findings, risk stratification, comorbidities and participant's personal goals.;Provide advice, education, support and counseling about physical activity/exercise needs.       Expected Outcomes Short Term: Attend rehab on a regular basis to increase amount of physical activity.;Long Term: Exercising regularly at least 3-5 days a week.;Long Term: Add in home exercise to make exercise part of routine and to increase amount of physical activity.       Increase Strength and Stamina Yes       Intervention Provide advice, education, support and counseling about physical activity/exercise needs.;Develop an individualized exercise prescription for aerobic and resistive training based on initial evaluation findings, risk stratification, comorbidities and participant's personal goals.       Expected Outcomes Short Term: Increase workloads from initial exercise prescription for resistance, speed, and METs.;Short Term: Perform resistance training exercises routinely during rehab and add in resistance training at home;Long Term: Improve cardiorespiratory fitness, muscular endurance and strength as measured by increased METs and functional capacity ( )       Able to understand and use rate of perceived exertion (RPE) scale Yes       Intervention Provide education and explanation on how to use RPE scale       Expected Outcomes Short Term: Able to use RPE daily in rehab to express subjective intensity  level;Long Term:  Able to use RPE to guide intensity level when exercising independently       Able to understand and use Dyspnea scale Yes       Intervention Provide education and explanation on how to use Dyspnea scale       Expected Outcomes Short Term: Able to use Dyspnea scale daily in rehab to express subjective sense of shortness of breath during exertion;Long Term: Able to  use Dyspnea scale to guide intensity level when exercising independently       Knowledge and understanding of Target Heart Rate Range (THRR) Yes       Intervention Provide education and explanation of THRR including how the numbers were predicted and where they are located for reference       Expected Outcomes Short Term: Able to state/look up THRR;Long Term: Able to use THRR to govern intensity when exercising independently;Short Term: Able to use daily as guideline for intensity in rehab       Able to check pulse independently Yes       Intervention Provide education and demonstration on how to check pulse in carotid and radial arteries.;Review the importance of being able to check your own pulse for safety during independent exercise       Expected Outcomes Short Term: Able to explain why pulse checking is important during independent exercise;Long Term: Able to check pulse independently and accurately       Understanding of Exercise Prescription Yes       Intervention Provide education, explanation, and written materials on patient's individual exercise prescription       Expected Outcomes Short Term: Able to explain program exercise prescription;Long Term: Able to explain home exercise prescription to exercise independently                Exercise Goals Re-Evaluation :  Exercise Goals Re-Evaluation     Row Name 06/22/23 1028 07/19/23 0800           Exercise Goal Re-Evaluation   Exercise Goals Review Able to understand and use rate of perceived exertion (RPE) scale;Able to understand and use Dyspnea  scale;Knowledge and understanding of Target Heart Rate Range (THRR);Understanding of Exercise Prescription Understanding of Exercise Prescription;Increase Physical Activity      Comments Reviewed RPE  and dyspnea scale, THR and program prescription with pt today.  Pt voiced understanding and was given a copy of goals to take home. Nicholis has increased a few levels. Biostep up to level 3 from level  2. He is using 4 lb hand weights. Will continue to encourage and monitor exercise  progression.      Expected Outcomes Short: Use RPE daily to regulate intensity. Long: Follow program prescription in THR. [id number] workloads as tolerated with goal of increased stamina and strength.   LTG: Continued exercise progression as tolerated during program and after discharge.               Discharge Exercise Prescription (Final Exercise Prescription Changes):  Exercise Prescription Changes - 07/19/23 0700       Response to Exercise   Blood Pressure (Admit) 122/78    Blood Pressure (Exercise) 168/80    Blood Pressure (Exit) 140/80    Heart Rate (Admit) 64 bpm    Heart Rate (Exercise) 118 bpm    Heart Rate (Exit) 97 bpm    Rating of Perceived Exertion (Exercise) 13      Progression   Average METs 3.23      Resistance Training   Training Prescription Yes    Weight 4 lb    Reps 10-15      Recumbant Bike   Level 1    Watts 15    Minutes 15    METs 2.4      NuStep   Level 3    Minutes 15    METs 3.6      Biostep-RELP   Level 3    Minutes 15  METs 3      Oxygen   Maintain Oxygen Saturation 88% or higher             Nutrition:  Target Goals: Understanding of nutrition guidelines, daily intake of sodium 1500mg , cholesterol 200mg , calories 30% from fat and 7% or less from saturated fats, daily to have 5 or more servings of fruits and vegetables.  Education: All About Nutrition: -Group instruction provided by verbal, written material, interactive activities, discussions,  models, and posters to present general guidelines for heart healthy nutrition including fat, fiber, MyPlate, the role of sodium in heart healthy nutrition, utilization of the nutrition label, and utilization of this knowledge for meal planning. Follow up email sent as well. Written material given at graduation.   Biometrics:  Pre Biometrics - 06/20/23 1507       Pre Biometrics   Height 5\' 11"  (1.803 m)    Weight 260 lb (117.9 kg)    Waist Circumference 47.5 inches    Hip Circumference 45.5 inches    Waist to Hip Ratio 1.04 %    BMI (Calculated) 36.28    Single Leg Stand 3.35 seconds              Nutrition Therapy Plan and Nutrition Goals:  Nutrition Therapy & Goals - 07/13/23 1143       Nutrition Therapy   Diet Cardiac, low Na, carb controlled diet    Protein (specify units) 90    Fiber 30 grams    Whole Grain Foods 3 servings    Saturated Fats 15 max. grams    Fruits and Vegetables 5 servings/day    Sodium 2 grams      Personal Nutrition Goals   Nutrition Goal Watch carb sources/portions at meals    Personal Goal #2 reduce sodium content    Comments Patient drinking more water now, encouraged him to continue to do so. He cut out sweet tea ~5weeks ago. Commended him on this change, encouraged him to watch is juice intake as it similar to sweet tea/soda. He says he mostly has it when his sugar drops low. He has a monitor on his arm and reports it has been dropping low throughout the day as of late. Talked to him about eating complex carbs and pairing them with protein/fat. Provided some examples like apple and peanut butter. Reviewed mediterranean diet handout, encouraged smaller portions, reducing sodium/sat fat content, eating less fried processed foods.      Intervention Plan   Intervention Prescribe, educate and counsel regarding individualized specific dietary modifications aiming towards targeted core components such as weight, hypertension, lipid management, diabetes,  heart failure and other comorbidities.;Nutrition handout(s) given to patient.    Expected Outcomes Short Term Goal: Understand basic principles of dietary content, such as calories, fat, sodium, cholesterol and nutrients.;Short Term Goal: A plan has been developed with personal nutrition goals set during dietitian appointment.;Long Term Goal: Adherence to prescribed nutrition plan.             Nutrition Assessments:  MEDIFICTS Score Key: ?70 Need to make dietary changes  40-70 Heart Healthy Diet ? 40 Therapeutic Level Cholesterol Diet  Flowsheet Row Cardiac Rehab from 06/20/2023 in [facility] Cardiac and Pulmonary Rehab  Picture Your Plate Total Score on Admission 46      Picture Your Plate Scores: <13 Unhealthy dietary pattern with much room for improvement. 41-50 Dietary pattern unlikely to meet recommendations for good health and room for improvement. 51-60 More healthful dietary pattern, with some room  for improvement.  >60 Healthy dietary pattern, although there may be some specific behaviors that could be improved.    Nutrition Goals Re-Evaluation:   Nutrition Goals Discharge (Final Nutrition Goals Re-Evaluation):   Psychosocial: Target Goals: Acknowledge presence or absence of significant depression and/or stress, maximize coping skills, provide positive support system. Participant is able to verbalize types and ability to use techniques and skills needed for reducing stress and depression.   Education: Stress, Anxiety, and Depression - Group verbal and visual presentation to define topics covered.  Reviews how body is impacted by stress, anxiety, and depression.  Also discusses healthy ways to reduce stress and to treat/manage anxiety and depression.  Written material given at graduation. Flowsheet Row Cardiac Rehab from 07/27/2023 in [facility] Cardiac and Pulmonary Rehab  Date 07/27/23  Educator SB  Instruction Review Code 1- [employer] Understanding       Education: Sleep  Hygiene -Provides group verbal and written instruction about how sleep can affect your health.  Define sleep hygiene, discuss sleep cycles and impact of sleep habits. Review good sleep hygiene tips.    Initial Review & Psychosocial Screening:  Initial Psych Review & Screening - 06/07/23 1023       Initial Review   Current issues with Current Stress Concerns    Source of Stress Concerns Financial;Family      Family Dynamics   Good Support System? Yes   wife, daughter lives behind him     Barriers   Psychosocial barriers to participate in program There are no identifiable barriers or psychosocial needs.      Screening Interventions   Interventions To provide support and resources with identified psychosocial needs;Provide feedback about the scores to participant    Expected Outcomes Short Term goal: Utilizing psychosocial counselor, staff and physician to assist with identification of specific Stressors or current issues interfering with healing process. Setting desired goal for each stressor or current issue identified.;Long Term Goal: Stressors or current issues are controlled or eliminated.;Short Term goal: Identification and review with participant of any Quality of Life or Depression concerns found by scoring the questionnaire.;Long Term goal: The participant improves quality of Life and PHQ9 Scores as seen by post scores and/or verbalization of changes             Quality of Life Scores:   Quality of Life - 06/20/23 1523       Quality of Life   Select Quality of Life      Quality of Life Scores   Health/Function Pre 18.27 %    Socioeconomic Pre 18.86 %    Psych/Spiritual Pre 23.36 %    Family Pre 20.4 %    GLOBAL Pre 19.75 %            Scores of 19 and below usually indicate a poorer quality of life in these areas.  A difference of  2-3 points is a clinically meaningful difference.  A difference of 2-3 points in the total score of the Quality of Life Index has been  associated with significant improvement in overall quality of life, self-image, physical symptoms, and general health in studies assessing change in quality of life.  PHQ-9: Review Flowsheet       06/20/2023  Depression screen PHQ 2/9  Decreased Interest 0  Down, Depressed, Hopeless 0  PHQ - 2 Score 0  Altered sleeping 0  Tired, decreased energy 3  Change in appetite 0  Feeling bad or failure about yourself  0  Trouble concentrating  0  Moving slowly or fidgety/restless 0  Suicidal thoughts 0  PHQ-9 Score 3  Difficult doing work/chores Not difficult at all    Details           Interpretation of Total Score  Total Score Depression Severity:  1-4 = Minimal depression, 5-9 = Mild depression, 10-14 = Moderate depression, 15-19 = Moderately severe depression, 20-27 = Severe depression   Psychosocial Evaluation and Intervention:  Psychosocial Evaluation - 06/07/23 1053       Psychosocial Evaluation & Interventions   Interventions Encouraged to exercise with the program and follow exercise prescription    Comments Mattia has no barriers to attending the program. He wants to increase his stamina and be as healthy as he can. He does have concern with financial and family matters.  His adult son recently had an MI. Criag will be attending the same CR sessions as his son.  He is ready to start the program.    Expected Outcomes STG Romero attends all scheduled sessions and is able to decrease his conerns about his son's health. He can find resources to help him with his financial concerns. lTG Ankur is able to cintinue with his exercise progression with his son.    Continue Psychosocial Services  Follow up required by staff             Psychosocial Re-Evaluation:  Psychosocial Re-Evaluation     Row Name 07/13/23 1001             Psychosocial Re-Evaluation   Comments Patient reports no issues with their current mental states, sleep, stress, depression or anxiety. Will follow  up with patient in a few weeks for any changes.       Expected Outcomes Short: Continue to exercise regularly to support mental health and notify staff of any changes. Long: maintain mental health and well being through teaching of rehab or prescribed medications independently.       Interventions Encouraged to attend Cardiac Rehabilitation for the exercise       Continue Psychosocial Services  Follow up required by staff                Psychosocial Discharge (Final Psychosocial Re-Evaluation):  Psychosocial Re-Evaluation - 07/13/23 1001       Psychosocial Re-Evaluation   Comments Patient reports no issues with their current mental states, sleep, stress, depression or anxiety. Will follow up with patient in a few weeks for any changes.    Expected Outcomes Short: Continue to exercise regularly to support mental health and notify staff of any changes. Long: maintain mental health and well being through teaching of rehab or prescribed medications independently.    Interventions Encouraged to attend Cardiac Rehabilitation for the exercise    Continue Psychosocial Services  Follow up required by staff             Vocational Rehabilitation: Provide vocational rehab assistance to qualifying candidates.   Vocational Rehab Evaluation & Intervention:   Education: Education Goals: Education classes will be provided on a variety of topics geared toward better understanding of heart health and risk factor modification. Participant will state understanding/return demonstration of topics presented as noted by education test scores.  Learning Barriers/Preferences:   General Cardiac Education Topics:  AED/CPR: - Group verbal and written instruction with the use of models to demonstrate the basic use of the AED with the basic ABC's of resuscitation.   Anatomy and Cardiac Procedures: - Group verbal and visual presentation and models provide  information about basic cardiac anatomy and  function. Reviews the testing methods done to diagnose heart disease and the outcomes of the test results. Describes the treatment choices: Medical Management, Angioplasty, or Coronary Bypass Surgery for treating various heart conditions including Myocardial Infarction, Angina, Valve Disease, and Cardiac Arrhythmias.  Written material given at graduation. Flowsheet Row Cardiac Rehab from 07/27/2023 in [facility] Cardiac and Pulmonary Rehab  Education need identified 06/20/23       Medication Safety: - Group verbal and visual instruction to review commonly prescribed medications for heart and lung disease. Reviews the medication, class of the drug, and side effects. Includes the steps to properly store meds and maintain the prescription regimen.  Written material given at graduation. Flowsheet Row Cardiac Rehab from 07/27/2023 in [facility] Cardiac and Pulmonary Rehab  Date 07/06/23  Educator SB  Instruction Review Code 1- Verbalizes Understanding       Intimacy: - Group verbal instruction through game format to discuss how heart and lung disease can affect sexual intimacy. Written material given at graduation..   Know Your Numbers and Heart Failure: - Group verbal and visual instruction to discuss disease risk factors for cardiac and pulmonary disease and treatment options.  Reviews associated critical values for Overweight/Obesity, Hypertension, Cholesterol, and Diabetes.  Discusses basics of heart failure: signs/symptoms and treatments.  Introduces Heart Failure Zone chart for action plan for heart failure.  Written material given at graduation.   Infection Prevention: - Provides verbal and written material to individual with discussion of infection control including proper hand washing and proper equipment cleaning during exercise session. Flowsheet Row Cardiac Rehab from 07/27/2023 in [facility] Cardiac and Pulmonary Rehab  Date 06/20/23  Educator NT  Instruction Review Code 1- Verbalizes Understanding        Falls Prevention: - Provides verbal and written material to individual with discussion of falls prevention and safety. Flowsheet Row Cardiac Rehab from 07/27/2023 in [facility] Cardiac and Pulmonary Rehab  Date 06/07/23  Educator SB  Instruction Review Code 1- Verbalizes Understanding       Other: -Provides group and verbal instruction on various topics (see comments) Flowsheet Row Cardiac Rehab from 07/27/2023 in [facility] Cardiac and Pulmonary Rehab  Date 06/22/23  Educator Jeopardy SB  Instruction Review Code 1- Verbalizes Understanding       Knowledge Questionnaire Score:  Knowledge Questionnaire Score - 06/20/23 1524       Knowledge Questionnaire Score   Pre Score 21/26             Core Components/Risk Factors/Patient Goals at Admission:  Personal Goals and Risk Factors at Admission - 06/07/23 1025       Core Components/Risk Factors/Patient Goals on Admission   Intervention Weight Management: Develop a combined nutrition and exercise program designed to reach desired caloric intake, while maintaining appropriate intake of nutrient and fiber, sodium and fats, and appropriate energy expenditure required for the weight goal.;Weight Management: Provide education and appropriate resources to help participant work on and attain dietary goals.    Admit Weight 260 lb (117.9 kg)    Goal Weight: Short Term 258 lb (117 kg)    Goal Weight: Long Term 220 lb (99.8 kg)    Expected Outcomes Short Term: Continue to assess and modify interventions until short term weight is achieved;Long Term: Adherence to nutrition and physical activity/exercise program aimed toward attainment of established weight goal;Weight Loss: Understanding of general recommendations for a balanced deficit meal plan, which promotes 1-2 lb weight loss per week and includes a  negative energy balance of [phone] kcal/d    Diabetes Yes    Intervention Provide education about signs/symptoms and action to take for  hypo/hyperglycemia.;Provide education about proper nutrition, including hydration, and aerobic/resistive exercise prescription along with prescribed medications to achieve blood glucose in normal ranges: Fasting glucose 65-99 mg/dL    Expected Outcomes Short Term: Participant verbalizes understanding of the signs/symptoms and immediate care of hyper/hypoglycemia, proper foot care and importance of medication, aerobic/resistive exercise and nutrition plan for blood glucose control.;Long Term: Attainment of HbA1C < 7%.    Hypertension Yes    Intervention Provide education on lifestyle modifcations including regular physical activity/exercise, weight management, moderate sodium restriction and increased consumption of fresh fruit, vegetables, and low fat dairy, alcohol moderation, and smoking cessation.;Monitor prescription use compliance.    Expected Outcomes Short Term: Continued assessment and intervention until BP is < 140/54mm HG in hypertensive participants. < 130/71mm HG in hypertensive participants with diabetes, heart failure or chronic kidney disease.;Long Term: Maintenance of blood pressure at goal levels.    Lipids Yes    Intervention Provide education and support for participant on nutrition & aerobic/resistive exercise along with prescribed medications to achieve LDL 70mg , HDL >40mg .    Expected Outcomes Short Term: Participant states understanding of desired cholesterol values and is compliant with medications prescribed. Participant is following exercise prescription and nutrition guidelines.;Long Term: Cholesterol controlled with medications as prescribed, with individualized exercise RX and with personalized nutrition plan. Value goals: LDL < 70mg , HDL > 40 mg.             Education:Diabetes - Individual verbal and written instruction to review signs/symptoms of diabetes, desired ranges of glucose level fasting, after meals and with exercise. Acknowledge that pre and post exercise  glucose checks will be done for 3 sessions at entry of program.   Core Components/Risk Factors/Patient Goals Review:   Goals and Risk Factor Review     Row Name 07/13/23 0958             Core Components/Risk Factors/Patient Goals Review   Personal Goals Review Weight Management/Obesity;Hypertension;Diabetes       Review Patient would like to lose some weight. He is in the process of meeting with the dietitian to go over his eating habits. His blood pressure has been doing well in the program and has no questions about that. He has been checking his sugars routinely and they have been within normal rangres. He would like to reach a weight goal of 220 pounds.       Expected Outcomes Short: lose a few pounds in the next few weeks. Long: reach weight goal.                Core Components/Risk Factors/Patient Goals at Discharge (Final Review):   Goals and Risk Factor Review - 07/13/23 0958       Core Components/Risk Factors/Patient Goals Review   Personal Goals Review Weight Management/Obesity;Hypertension;Diabetes    Review Patient would like to lose some weight. He is in the process of meeting with the dietitian to go over his eating habits. His blood pressure has been doing well in the program and has no questions about that. He has been checking his sugars routinely and they have been within normal rangres. He would like to reach a weight goal of 220 pounds.    Expected Outcomes Short: lose a few pounds in the next few weeks. Long: reach weight goal.  ITP Comments:  ITP Comments     Row Name 06/07/23 1051 06/20/23 1238 06/22/23 1027 07/04/23 1400 07/25/23 1029   ITP Comments Virtual orientation call completed today. he has an appointment on Date: 06/20/2023  for EP eval and gym Orientation.  Documentation of diagnosis can be found in [facility] Date: 06/05/2023 . Completed and gym orientation. Initial ITP created and sent for review to Dr. Bethann Punches, Medical  Director. First full day of exercise!  Patient was oriented to gym and equipment including functions, settings, policies, and procedures.  Patient's individual exercise prescription and treatment plan were reviewed.  All starting workloads were established based on the results of the 6 minute walk test done at initial orientation visit.  The plan for exercise progression was also introduced and progression will be customized based on patient's performance and goals. 30 Day review completed. Medical Director ITP review done, changes made as directed, and signed approval by Medical Director.   new to program Monjoro stopped and insulin resumed    Row Name 08/02/23 1123           ITP Comments 30 Day review completed. Medical Director ITP review done, changes made as directed, and signed approval by Medical Director.                Comments:

## 2023-08-08 ENCOUNTER — Encounter: Payer: Medicare PPO | Admitting: *Deleted

## 2023-08-08 DIAGNOSIS — Z955 Presence of coronary angioplasty implant and graft: Secondary | ICD-10-CM

## 2023-08-08 DIAGNOSIS — Z48812 Encounter for surgical aftercare following surgery on the circulatory system: Secondary | ICD-10-CM | POA: Diagnosis not present

## 2023-08-08 NOTE — Progress Notes (Signed)
Daily Session Note  Patient Details  Name: Nicholas Acevedo MRN: [id number] Date of Birth: July 22, 1946 Referring Provider:   Flowsheet Row Cardiac Rehab from 06/20/2023 in [facility] Cardiac and Pulmonary Rehab  Referring Provider Dr. Massie Maroon, MD       Encounter Date: 08/08/2023  Check In:  Session Check In - 08/08/23 1044       Check-In   Supervising physician immediately available to respond to emergencies See telemetry face sheet for immediately available ER MD    Location [facility]-Cardiac & Pulmonary Rehab    Staff Present Cora Collum, RN, BSN, CCRP;Meredith Jewel Baize, RN BSN;Other   Maxon Conetta BS, Exercise Physiologist   Virtual Visit No    Medication changes reported     No    Fall or balance concerns reported    No    Warm-up and Cool-down Performed on first and last piece of equipment    Resistance Training Performed Yes    VAD Patient? No    PAD/SET Patient? No      Pain Assessment   Currently in Pain? No/denies                Social History   Tobacco Use  Smoking Status Never  Smokeless Tobacco Never    Goals Met:  Independence with exercise equipment Exercise tolerated well No report of concerns or symptoms today  Goals Unmet:  Not Applicable  Comments: Pt able to follow exercise prescription today without complaint.  Will continue to monitor for progression.    Dr. Bethann Punches is Medical Director for [facility] Cardiac Rehabilitation.  Dr. Vida Rigger is Medical Director for [facility] Pulmonary Rehabilitation.

## 2023-08-10 ENCOUNTER — Encounter: Payer: Medicare PPO | Admitting: *Deleted

## 2023-08-10 DIAGNOSIS — Z48812 Encounter for surgical aftercare following surgery on the circulatory system: Secondary | ICD-10-CM | POA: Diagnosis not present

## 2023-08-10 DIAGNOSIS — Z955 Presence of coronary angioplasty implant and graft: Secondary | ICD-10-CM

## 2023-08-10 NOTE — Progress Notes (Signed)
Daily Session Note  Patient Details  Name: Nicholas Acevedo MRN: [id number] Date of Birth: [birth year] Referring Provider:   Flowsheet Row Cardiac Rehab from 06/20/2023 in [facility] Cardiac and Pulmonary Rehab  Referring Provider Dr. Massie Maroon, MD       Encounter Date: 08/10/2023  Check In:  Session Check In - 08/10/23 0937       Check-In   Supervising physician immediately available to respond to emergencies See telemetry face sheet for immediately available ER MD    Location [facility]-Cardiac & Pulmonary Rehab    Staff Present Cora Collum, RN, BSN, CCRP;Maxon Conetta BS, , Exercise Physiologist;Iver Fehrenbach Katrinka Blazing, RN, ADN    Virtual Visit No    Medication changes reported     No    Fall or balance concerns reported    No    Warm-up and Cool-down Performed on first and last piece of equipment    Resistance Training Performed Yes    VAD Patient? No    PAD/SET Patient? No      Pain Assessment   Currently in Pain? No/denies                Social History   Tobacco Use  Smoking Status Never  Smokeless Tobacco Never    Goals Met:  Independence with exercise equipment Exercise tolerated well No report of concerns or symptoms today Strength training completed today  Goals Unmet:  Not Applicable  Comments: Pt able to follow exercise prescription today without complaint.  Will continue to monitor for progression.    Dr. Bethann Punches is Medical Director for [facility] Cardiac Rehabilitation.  Dr. Vida Rigger is Medical Director for [facility] Pulmonary Rehabilitation.

## 2023-08-15 ENCOUNTER — Encounter: Payer: Medicare PPO | Admitting: *Deleted

## 2023-08-15 DIAGNOSIS — Z955 Presence of coronary angioplasty implant and graft: Secondary | ICD-10-CM | POA: Diagnosis not present

## 2023-08-15 DIAGNOSIS — Z48812 Encounter for surgical aftercare following surgery on the circulatory system: Secondary | ICD-10-CM | POA: Diagnosis not present

## 2023-08-15 NOTE — Progress Notes (Signed)
Daily Session Note  Patient Details  Name: MELBIN PACH MRN: [id number] Date of Birth: 1946/09/14 Referring Provider:   Flowsheet Row Cardiac Rehab from 06/20/2023 in [facility] Cardiac and Pulmonary Rehab  Referring Provider Dr. Massie Maroon, MD       Encounter Date: 08/15/2023  Check In:  Session Check In - 08/15/23 1019       Check-In   Supervising physician immediately available to respond to emergencies See telemetry face sheet for immediately available ER MD    Location [facility]-Cardiac & Pulmonary Rehab    Staff Present Maxon Suzzette Righter, , Exercise Physiologist;Zackory Pudlo Jewel Baize, RN BSN;Jason Wallace Cullens, RDN, Luiz Iron, BS, Exercise Physiologist    Virtual Visit No    Medication changes reported     No    Fall or balance concerns reported    No    Warm-up and Cool-down Performed on first and last piece of equipment    Resistance Training Performed Yes    VAD Patient? No      Pain Assessment   Currently in Pain? No/denies                Social History   Tobacco Use  Smoking Status Never  Smokeless Tobacco Never    Goals Met:  Independence with exercise equipment Exercise tolerated well No report of concerns or symptoms today Strength training completed today  Goals Unmet:  Not Applicable  Comments: Pt able to follow exercise prescription today without complaint.  Will continue to monitor for progression.    Dr. Bethann Punches is Medical Director for [facility] Cardiac Rehabilitation.  Dr. Vida Rigger is Medical Director for [facility] Pulmonary Rehabilitation.

## 2023-08-17 ENCOUNTER — Encounter: Payer: Medicare PPO | Admitting: *Deleted

## 2023-08-17 DIAGNOSIS — Z48812 Encounter for surgical aftercare following surgery on the circulatory system: Secondary | ICD-10-CM | POA: Diagnosis not present

## 2023-08-17 DIAGNOSIS — Z955 Presence of coronary angioplasty implant and graft: Secondary | ICD-10-CM

## 2023-08-17 NOTE — Progress Notes (Signed)
Daily Session Note  Patient Details  Name: Nicholas Acevedo MRN: [id number] Date of Birth: Apr 16, 1946 Referring Provider:   Flowsheet Row Cardiac Rehab from 06/20/2023 in [facility] Cardiac and Pulmonary Rehab  Referring Provider Dr. Massie Maroon, MD       Encounter Date: 08/17/2023  Check In:  Session Check In - 08/17/23 1000       Check-In   Supervising physician immediately available to respond to emergencies See telemetry face sheet for immediately available ER MD    Location [facility]-Cardiac & Pulmonary Rehab    Staff Present Cora Collum, RN, BSN, CCRP;Noah Tickle, BS, Exercise Physiologist;Maxon Conetta BS, , Exercise Physiologist    Virtual Visit No    Medication changes reported     No    Fall or balance concerns reported    No    Warm-up and Cool-down Performed on first and last piece of equipment    Resistance Training Performed Yes    VAD Patient? No    PAD/SET Patient? No      Pain Assessment   Currently in Pain? No/denies                Social History   Tobacco Use  Smoking Status Never  Smokeless Tobacco Never    Goals Met:  Independence with exercise equipment Exercise tolerated well No report of concerns or symptoms today  Goals Unmet:  Not Applicable  Comments: Pt able to follow exercise prescription today without complaint.  Will continue to monitor for progression.    Dr. Bethann Punches is Medical Director for [facility] Cardiac Rehabilitation.  Dr. Vida Rigger is Medical Director for [facility] Pulmonary Rehabilitation.

## 2023-08-22 ENCOUNTER — Encounter: Payer: Medicare PPO | Admitting: *Deleted

## 2023-08-22 DIAGNOSIS — Z48812 Encounter for surgical aftercare following surgery on the circulatory system: Secondary | ICD-10-CM | POA: Diagnosis not present

## 2023-08-22 DIAGNOSIS — Z955 Presence of coronary angioplasty implant and graft: Secondary | ICD-10-CM

## 2023-08-22 NOTE — Progress Notes (Signed)
Daily Session Note  Patient Details  Name: Nicholas Acevedo MRN: [id number] Date of Birth: 05-Mar-1946 Referring Provider:   Flowsheet Row Cardiac Rehab from 06/20/2023 in [facility] Cardiac and Pulmonary Rehab  Referring Provider Dr. Massie Maroon, MD       Encounter Date: 08/22/2023  Check In:  Session Check In - 08/22/23 1022       Check-In   Supervising physician immediately available to respond to emergencies See telemetry face sheet for immediately available ER MD    Location [facility]-Cardiac & Pulmonary Rehab    Staff Present Cora Collum, RN, BSN, CCRP;Noah Tickle, BS, Exercise Physiologist;Maxon Conetta BS, , Exercise Physiologist    Virtual Visit No    Medication changes reported     No    Fall or balance concerns reported    No    Warm-up and Cool-down Performed on first and last piece of equipment    Resistance Training Performed Yes    VAD Patient? No    PAD/SET Patient? No      Pain Assessment   Currently in Pain? No/denies                Social History   Tobacco Use  Smoking Status Never  Smokeless Tobacco Never    Goals Met:  Independence with exercise equipment Exercise tolerated well No report of concerns or symptoms today  Goals Unmet:  Not Applicable  Comments: Pt able to follow exercise prescription today without complaint.  Will continue to monitor for progression.    Dr. Bethann Punches is Medical Director for [facility] Cardiac Rehabilitation.  Dr. Vida Rigger is Medical Director for [facility] Pulmonary Rehabilitation.

## 2023-08-24 ENCOUNTER — Encounter: Payer: Medicare PPO | Admitting: *Deleted

## 2023-08-24 DIAGNOSIS — Z955 Presence of coronary angioplasty implant and graft: Secondary | ICD-10-CM

## 2023-08-24 DIAGNOSIS — Z48812 Encounter for surgical aftercare following surgery on the circulatory system: Secondary | ICD-10-CM | POA: Diagnosis not present

## 2023-08-24 NOTE — Progress Notes (Signed)
Daily Session Note  Patient Details  Name: Nicholas Acevedo MRN: [id number] Date of Birth: May 03, 1946 Referring Provider:   Flowsheet Row Cardiac Rehab from 06/20/2023 in [facility] Cardiac and Pulmonary Rehab  Referring Provider Dr. Massie Maroon, MD       Encounter Date: 08/24/2023  Check In:  Session Check In - 08/24/23 1038       Check-In   Supervising physician immediately available to respond to emergencies See telemetry face sheet for immediately available ER MD    Location [facility]-Cardiac & Pulmonary Rehab    Staff Present Ronette Deter, BS, Exercise Physiologist;Margaret Best, MS, Exercise Physiologist;Delores Thelen Katrinka Blazing, RN, ADN    Virtual Visit No    Medication changes reported     No    Fall or balance concerns reported    No    Warm-up and Cool-down Performed on first and last piece of equipment    Resistance Training Performed Yes    VAD Patient? No    PAD/SET Patient? No      Pain Assessment   Currently in Pain? No/denies                Social History   Tobacco Use  Smoking Status Never  Smokeless Tobacco Never    Goals Met:  Independence with exercise equipment Exercise tolerated well No report of concerns or symptoms today Strength training completed today  Goals Unmet:  Not Applicable  Comments: Pt able to follow exercise prescription today without complaint.  Will continue to monitor for progression.    Dr. Bethann Punches is Medical Director for [facility] Cardiac Rehabilitation.  Dr. Vida Rigger is Medical Director for [facility] Pulmonary Rehabilitation.

## 2023-08-29 ENCOUNTER — Encounter: Payer: Medicare PPO | Attending: Cardiology | Admitting: *Deleted

## 2023-08-29 DIAGNOSIS — Z955 Presence of coronary angioplasty implant and graft: Secondary | ICD-10-CM | POA: Diagnosis not present

## 2023-08-29 NOTE — Progress Notes (Signed)
Daily Session Note  Patient Details  Name: Nicholas Acevedo MRN: [id number] Date of Birth: [birth year] Referring Provider:   Flowsheet Row Cardiac Rehab from 06/20/2023 in [facility] Cardiac and Pulmonary Rehab  Referring Provider Dr. Massie Maroon, MD       Encounter Date: 08/29/2023  Check In:  Session Check In - 08/29/23 1008       Check-In   Supervising physician immediately available to respond to emergencies See telemetry face sheet for immediately available ER MD    Location [facility]-Cardiac & Pulmonary Rehab    Staff Present Ronette Deter, BS, Exercise Physiologist;Maxon Conetta BS, , Exercise Physiologist;Odester Nilson Katrinka Blazing, RN, ADN    Virtual Visit No    Medication changes reported     No    Fall or balance concerns reported    No    Warm-up and Cool-down Performed on first and last piece of equipment    Resistance Training Performed Yes    VAD Patient? No    PAD/SET Patient? No      Pain Assessment   Currently in Pain? No/denies                Social History   Tobacco Use  Smoking Status Never  Smokeless Tobacco Never    Goals Met:  Independence with exercise equipment Exercise tolerated well No report of concerns or symptoms today Strength training completed today  Goals Unmet:  Not Applicable  Comments: Pt able to follow exercise prescription today without complaint.  Will continue to monitor for progression.    Dr. Bethann Punches is Medical Director for [facility] Cardiac Rehabilitation.  Dr. Vida Rigger is Medical Director for [facility] Pulmonary Rehabilitation.

## 2023-08-30 ENCOUNTER — Encounter: Payer: Self-pay | Admitting: *Deleted

## 2023-08-30 DIAGNOSIS — Z955 Presence of coronary angioplasty implant and graft: Secondary | ICD-10-CM

## 2023-08-30 NOTE — Progress Notes (Signed)
Cardiac Individual Treatment Plan  Patient Details  Name: MACARTHUR NIZIOLEK MRN: [id number] Date of Birth: 1946/12/15 Referring Provider:   Flowsheet Row Cardiac Rehab from 06/20/2023 in [facility] Cardiac and Pulmonary Rehab  Referring Provider Dr. Massie Maroon, MD       Initial Encounter Date:  Flowsheet Row Cardiac Rehab from 06/20/2023 in [facility] Cardiac and Pulmonary Rehab  Date 06/20/23       Visit Diagnosis: Status post coronary artery stent placement  Patient's Home Medications on Admission:  Current Outpatient Medications:    ascorbic acid (VITAMIN C) 500 MG tablet, Take by mouth. (Patient not taking: Reported on 06/07/2023), Disp: , Rfl:    aspirin EC 81 MG tablet, Take 81 mg by mouth daily., Disp: , Rfl:    atorvastatin (LIPITOR) 40 MG tablet, Take 40 mg by mouth daily., Disp: , Rfl:    brimonidine (ALPHAGAN) 0.2 % ophthalmic solution, Place 1 drop into both eyes 3 (three) times daily., Disp: , Rfl:    Cholecalciferol 50 MCG (2000 UT) CAPS, Take by mouth. (Patient not taking: Reported on 06/07/2023), Disp: , Rfl:    clopidogrel (PLAVIX) 75 MG tablet, Take 75 mg by mouth daily., Disp: , Rfl:    cyanocobalamin 1000 MCG tablet, Take by mouth. (Patient not taking: Reported on 06/07/2023), Disp: , Rfl:    dorzolamide-timolol (COSOPT) 22.3-6.8 MG/ML ophthalmic solution, , Disp: , Rfl:    Dulaglutide 0.75 MG/0.5ML SOPN, Inject into the skin. (Patient not taking: Reported on 06/07/2023), Disp: , Rfl:    empagliflozin (JARDIANCE) 25 MG TABS tablet, Take 25 mg by mouth daily. 25 mg tab take half a tab (12.5mg )  daily, Disp: , Rfl:    glipiZIDE (GLUCOTROL) 5 MG tablet, TAKE 1 TABLET BY MOUTH TWICE DAILY BEFORE MEAL(S), Disp: , Rfl:    hydrocortisone 2.5 % cream, Use as directed. Instructions in handout, Disp: 30 g, Rfl: 3   LANTUS SOLOSTAR 100 UNIT/ML Solostar Pen, Inject 45 Units into the skin daily. AM, Disp: , Rfl:    latanoprost (XALATAN) 0.005 % ophthalmic solution, 1 drop at bedtime. (Patient  not taking: Reported on 06/07/2023), Disp: , Rfl:    losartan (COZAAR) 50 MG tablet, Take 50 mg by mouth daily., Disp: , Rfl:    metFORMIN (GLUCOPHAGE) 1000 MG tablet, Take 1,000 mg by mouth 2 (two) times daily with a meal., Disp: , Rfl:    MOUNJARO 5 MG/0.5ML Pen, Inject 5 mg into the skin once a week. (Patient not taking: Reported on 07/18/2023), Disp: , Rfl:    Netarsudil-Latanoprost (ROCKLATAN) 0.02-0.005 % SOLN, 1 drop nightly, Disp: , Rfl:    omeprazole (PRILOSEC OTC) 20 MG tablet, Take by mouth., Disp: , Rfl:    omeprazole (PRILOSEC) 40 MG capsule, Take 40 mg by mouth daily. (Patient not taking: Reported on 06/07/2023), Disp: , Rfl:    ONE TOUCH ULTRA TEST test strip, USE ONE STRIP TO CHECK GLUCOSE ONCE DAILY, Disp: , Rfl: 3   sildenafil (REVATIO) 20 MG tablet, 2-5 tabs 1 hour prior to intercourse (Patient not taking: Reported on 06/07/2023), Disp: 15 tablet, Rfl: 0   tamsulosin (FLOMAX) 0.4 MG CAPS capsule, Take 1 capsule (0.4 mg total) by mouth daily., Disp: 90 capsule, Rfl: 3  Past Medical History: Past Medical History:  Diagnosis Date   Actinic keratosis 10/31/2007   R forearm - bx proven    Basal cell carcinoma 03/15/2007   L alar groove    Basal cell carcinoma 08/20/2018   L upper med clavicle  BCC (basal cell carcinoma of skin) 11/23/2021   Left lower cheek above jaw, EDC   Cancer (HCC)    prostate   Diabetes mellitus without complication (HCC)    ED (erectile dysfunction)    Esophagitis    GERD (gastroesophageal reflux disease)    Glaucoma    Hyperlipemia    Hypertension    Impotence of organic origin    Sleep apnea    Urinary obstruction     Tobacco Use: Social History   Tobacco Use  Smoking Status Never  Smokeless Tobacco Never    Labs: Review Flowsheet        No data to display           Exercise Target Goals: Exercise Program Goal: Individual exercise prescription set using results from initial 6 min walk test and THRR while considering   patient's activity barriers and safety.   Exercise Prescription Goal: Initial exercise prescription builds to 30-45 minutes a day of aerobic activity, 2-3 days per week.  Home exercise guidelines will be given to patient during program as part of exercise prescription that the participant will acknowledge.   Education: Aerobic Exercise: - Group verbal and visual presentation on the components of exercise prescription. Introduces F.I.T.T principle from ACSM for exercise prescriptions.  Reviews F.I.T.T. principles of aerobic exercise including progression. Written material given at graduation. Flowsheet Row Cardiac Rehab from 08/17/2023 in [facility] Cardiac and Pulmonary Rehab  Education need identified 06/20/23       Education: Resistance Exercise: - Group verbal and visual presentation on the components of exercise prescription. Introduces F.I.T.T principle from ACSM for exercise prescriptions  Reviews F.I.T.T. principles of resistance exercise including progression. Written material given at graduation.    Education: Exercise & Equipment Safety: - Individual verbal instruction and demonstration of equipment use and safety with use of the equipment. Flowsheet Row Cardiac Rehab from 08/17/2023 in [facility] Cardiac and Pulmonary Rehab  Date 06/20/23  Educator NT  Instruction Review Code 1- Verbalizes Understanding       Education: Exercise Physiology & General Exercise Guidelines: - Group verbal and written instruction with models to review the exercise physiology of the cardiovascular system and associated critical values. Provides general exercise guidelines with specific guidelines to those with heart or lung disease.    Education: Flexibility, Balance, Mind/Body Relaxation: - Group verbal and visual presentation with interactive activity on the components of exercise prescription. Introduces F.I.T.T principle from ACSM for exercise prescriptions. Reviews F.I.T.T. principles of flexibility and  balance exercise training including progression. Also discusses the mind body connection.  Reviews various relaxation techniques to help reduce and manage stress (i.e. Deep breathing, progressive muscle relaxation, and visualization). Balance handout provided to take home. Written material given at graduation. Flowsheet Row Cardiac Rehab from 08/17/2023 in [facility] Cardiac and Pulmonary Rehab  Date 08/17/23  Educator NT  Instruction Review Code 1- Verbalizes Understanding       Activity Barriers & Risk Stratification:  Activity Barriers & Cardiac Risk Stratification - 06/20/23 1506       Activity Barriers & Cardiac Risk Stratification   Activity Barriers Other (comment)    Comments Glaucoma impedes vision    Cardiac Risk Stratification Moderate             6 Minute Walk:  6 Minute Walk     Row Name 06/20/23 1505         6 Minute Walk   Phase Initial     Distance 1130 feet  Walk Time 6 minutes     # of Rest Breaks 0     MPH 2.14     METS 1.92     RPE 11     Perceived Dyspnea  1     VO2 Peak 6.71     Symptoms No     Resting HR 71 bpm     Resting BP 126/74     Resting Oxygen Saturation  97 %     Exercise Oxygen Saturation  during 6 min walk 95 %     Max Ex. HR 104 bpm     Max Ex. BP 144/68     2 Minute Post BP 136/74              Oxygen Initial Assessment:   Oxygen Re-Evaluation:   Oxygen Discharge (Final Oxygen Re-Evaluation):   Initial Exercise Prescription:  Initial Exercise Prescription - 06/20/23 1500       Date of Initial Exercise RX and Referring Provider   Date 06/20/23    Referring Provider Dr. Massie Maroon, MD      Oxygen   Maintain Oxygen Saturation 88% or higher      Recumbant Bike   Level 1    RPM 50    Watts 15    Minutes 15    METs 1.92      NuStep   Level 2    SPM 80    Minutes 15    METs 1.92      Biostep-RELP   Level 1    SPM 50    Minutes 15    METs 1.92      Prescription Details   Frequency (times per week) 2     Duration Progress to 30 minutes of continuous aerobic without signs/symptoms of physical distress      Intensity   THRR 40-80% of Max Heartrate 99-128    Ratings of Perceived Exertion 11-13    Perceived Dyspnea 0-4      Progression   Progression Continue to progress workloads to maintain intensity without signs/symptoms of physical distress.      Resistance Training   Training Prescription Yes    Weight 4 lb    Reps 10-15             Perform Capillary Blood Glucose checks as needed.  Exercise Prescription Changes:   Exercise Prescription Changes     Row Name 06/20/23 1500 06/22/23 1000 07/19/23 0700 08/02/23 1500 08/15/23 1300     Response to Exercise   Blood Pressure (Admit) 126/74 126/74 122/78 142/72 122/60   Blood Pressure (Exercise) 144/68 144/68 168/80 148/74 142/64   Blood Pressure (Exit) 136/74 136/74 140/80 124/62 116/60   Heart Rate (Admit) 71 bpm 71 bpm 64 bpm 66 bpm 60 bpm   Heart Rate (Exercise) 104 bpm 104 bpm 118 bpm 119 bpm 127 bpm   Heart Rate (Exit) 87 bpm 87 bpm 97 bpm 102 bpm 101 bpm   Oxygen Saturation (Admit) 97 % 97 % -- -- --   Oxygen Saturation (Exercise) 95 % 95 % -- -- --   Rating of Perceived Exertion (Exercise) 11 11 13 14 13    Perceived Dyspnea (Exercise) 1 1 -- -- --   Symptoms None None -- none none   Comments Results Results -- -- --   Duration -- -- -- Progress to 30 minutes of  aerobic without signs/symptoms of physical distress Progress to 30 minutes of  aerobic without signs/symptoms of physical  distress   Intensity -- -- -- THRR unchanged THRR unchanged     Progression   Progression -- -- -- Continue to progress workloads to maintain intensity without signs/symptoms of physical distress. Continue to progress workloads to maintain intensity without signs/symptoms of physical distress.   Average METs -- -- 3.23 3.44 3.04     Resistance Training   Training Prescription -- Yes Yes Yes Yes   Weight -- 4 lb 4 lb 4 lb 5    Reps -- 10-15 10-15 10-15 10-15     Interval Training   Interval Training -- -- -- No No     Recumbant Bike   Level -- 1 1 -- --   RPM -- 50 -- -- --   Watts -- 15 15 -- --   Minutes -- 15 15 -- --   METs -- 1.92 2.4 -- --     NuStep   Level -- 2 3 6 4    SPM -- 80 -- -- --   Minutes -- 15 15 15 15    METs -- 1.92 3.6 3.9 4.3     T5 Nustep   Level -- -- -- -- 1  T6   Minutes -- -- -- -- 15   METs -- -- -- -- 2.9     Biostep-RELP   Level -- 1 3 -- --   SPM -- 50 -- -- --   Minutes -- 15 15 -- --   METs -- 1.92 3 -- --     Track   Laps -- -- -- 30 35   Minutes -- -- -- 15 15   METs -- -- -- 2.63 2.9     Oxygen   Maintain Oxygen Saturation -- -- 88% or higher 88% or higher 88% or higher    Row Name 08/29/23 1500             Response to Exercise   Blood Pressure (Admit) 138/66       Blood Pressure (Exit) 122/64       Heart Rate (Admit) 67 bpm       Heart Rate (Exercise) 123 bpm       Heart Rate (Exit) 100 bpm       Rating of Perceived Exertion (Exercise) 14       Symptoms none       Duration Progress to 30 minutes of  aerobic without signs/symptoms of physical distress       Intensity THRR unchanged         Progression   Progression Continue to progress workloads to maintain intensity without signs/symptoms of physical distress.       Average METs 3.44         Resistance Training   Training Prescription Yes       Weight 5       Reps 10-15         Interval Training   Interval Training No         NuStep   Level 5       Minutes 15       METs 3.8         Track   Laps 45       Minutes 15       METs 3.45         Oxygen   Maintain Oxygen Saturation 88% or higher                Exercise Comments:   Exercise  Comments     Row Name 06/22/23 1028           Exercise Comments First full day of exercise!  Patient was oriented to gym and equipment including functions, settings, policies, and procedures.  Patient's individual exercise  prescription and treatment plan were reviewed.  All starting workloads were established based on the results of the 6 minute walk test done at initial orientation visit.  The plan for exercise progression was also introduced and progression will be customized based on patient's performance and goals.                Exercise Goals and Review:   Exercise Goals     Row Name 06/20/23 1239             Exercise Goals   Increase Physical Activity Yes       Intervention Develop an individualized exercise prescription for aerobic and resistive training based on initial evaluation findings, risk stratification, comorbidities and participant's personal goals.;Provide advice, education, support and counseling about physical activity/exercise needs.       Expected Outcomes Short Term: Attend rehab on a regular basis to increase amount of physical activity.;Long Term: Exercising regularly at least 3-5 days a week.;Long Term: Add in home exercise to make exercise part of routine and to increase amount of physical activity.       Increase Strength and Stamina Yes       Intervention Provide advice, education, support and counseling about physical activity/exercise needs.;Develop an individualized exercise prescription for aerobic and resistive training based on initial evaluation findings, risk stratification, comorbidities and participant's personal goals.       Expected Outcomes Short Term: Increase workloads from initial exercise prescription for resistance, speed, and METs.;Short Term: Perform resistance training exercises routinely during rehab and add in resistance training at home;Long Term: Improve cardiorespiratory fitness, muscular endurance and strength as measured by increased METs and functional capacity ( )       Able to understand and use rate of perceived exertion (RPE) scale Yes       Intervention Provide education and explanation on how to use RPE scale       Expected Outcomes Short  Term: Able to use RPE daily in rehab to express subjective intensity level;Long Term:  Able to use RPE to guide intensity level when exercising independently       Able to understand and use Dyspnea scale Yes       Intervention Provide education and explanation on how to use Dyspnea scale       Expected Outcomes Short Term: Able to use Dyspnea scale daily in rehab to express subjective sense of shortness of breath during exertion;Long Term: Able to use Dyspnea scale to guide intensity level when exercising independently       Knowledge and understanding of Target Heart Rate Range (THRR) Yes       Intervention Provide education and explanation of THRR including how the numbers were predicted and where they are located for reference       Expected Outcomes Short Term: Able to state/look up THRR;Long Term: Able to use THRR to govern intensity when exercising independently;Short Term: Able to use daily as guideline for intensity in rehab       Able to check pulse independently Yes       Intervention Provide education and demonstration on how to check pulse in carotid and radial arteries.;Review the importance of being able to check your own pulse for safety during  independent exercise       Expected Outcomes Short Term: Able to explain why pulse checking is important during independent exercise;Long Term: Able to check pulse independently and accurately       Understanding of Exercise Prescription Yes       Intervention Provide education, explanation, and written materials on patient's individual exercise prescription       Expected Outcomes Short Term: Able to explain program exercise prescription;Long Term: Able to explain home exercise prescription to exercise independently                Exercise Goals Re-Evaluation :  Exercise Goals Re-Evaluation     Row Name 06/22/23 1028 07/19/23 0800 08/02/23 1522 08/15/23 1346 08/29/23 1528     Exercise Goal Re-Evaluation   Exercise Goals Review Able  to understand and use rate of perceived exertion (RPE) scale;Able to understand and use Dyspnea scale;Knowledge and understanding of Target Heart Rate Range (THRR);Understanding of Exercise Prescription Understanding of Exercise Prescription;Increase Physical Activity Understanding of Exercise Prescription;Increase Physical Activity;Increase Strength and Stamina Understanding of Exercise Prescription;Increase Physical Activity;Increase Strength and Stamina Understanding of Exercise Prescription;Increase Physical Activity;Increase Strength and Stamina   Comments Reviewed RPE  and dyspnea scale, THR and program prescription with pt today.  Pt voiced understanding and was given a copy of goals to take home. Falon has increased a few levels. Biostep up to level 3 from level  2. He is using 4 lb hand weights. Will continue to encourage and monitor exercise  progression. Mikiah is doing well in rehab. He recently increased his laps walked on the track up to 30 laps. He also improved to level 6 on the T4 nustep. He may benefit from adding more seated machines for diversity. We will continue to monitor his progress in the program. Zyeir is doing well in rehab. He recently increased his hand weights from 4 to 5 lbs. He also increased his track laps to 35 in 15 minutes, and we implemented the new T6 nustep into his exercise prescription. We will continue to monitor his progress in the program. Jaiceion is doing well in rehab. He recently increased his T4 nustep level from 4 to 5. He also increased his track laps from 35 in 15 minutes to 45. We will continue to monitor his progress in the program.   Expected Outcomes Short: Use RPE daily to regulate intensity. Long: Follow program prescription in THR. [id number] workloads as tolerated with goal of increased stamina and strength.   LTG: Continued exercise progression as tolerated during program and after discharge. Short: Try other seated machines in addition to T4 nustep.  Long: Continue exercise to improve strength and stamina. Short: Continue to use the T6 nustep. Long: Continue exercise to improve strength and stamina. Short: Continue to follow current exercise prescription, and progressively increase workloads. Long: Continue exercise to improve strength and stamina.            Discharge Exercise Prescription (Final Exercise Prescription Changes):  Exercise Prescription Changes - 08/29/23 1500       Response to Exercise   Blood Pressure (Admit) 138/66    Blood Pressure (Exit) 122/64    Heart Rate (Admit) 67 bpm    Heart Rate (Exercise) 123 bpm    Heart Rate (Exit) 100 bpm    Rating of Perceived Exertion (Exercise) 14    Symptoms none    Duration Progress to 30 minutes of  aerobic without signs/symptoms of physical distress    Intensity THRR unchanged  Progression   Progression Continue to progress workloads to maintain intensity without signs/symptoms of physical distress.    Average METs 3.44      Resistance Training   Training Prescription Yes    Weight 5    Reps 10-15      Interval Training   Interval Training No      NuStep   Level 5    Minutes 15    METs 3.8      Track   Laps 45    Minutes 15    METs 3.45      Oxygen   Maintain Oxygen Saturation 88% or higher             Nutrition:  Target Goals: Understanding of nutrition guidelines, daily intake of sodium 1500mg , cholesterol 200mg , calories 30% from fat and 7% or less from saturated fats, daily to have 5 or more servings of fruits and vegetables.  Education: All About Nutrition: -Group instruction provided by verbal, written material, interactive activities, discussions, models, and posters to present general guidelines for heart healthy nutrition including fat, fiber, MyPlate, the role of sodium in heart healthy nutrition, utilization of the nutrition label, and utilization of this knowledge for meal planning. Follow up email sent as well. Written material  given at graduation.   Biometrics:  Pre Biometrics - 06/20/23 1507       Pre Biometrics   Height 5\' 11"  (1.803 m)    Weight 260 lb (117.9 kg)    Waist Circumference 47.5 inches    Hip Circumference 45.5 inches    Waist to Hip Ratio 1.04 %    BMI (Calculated) 36.28    Single Leg Stand 3.35 seconds              Nutrition Therapy Plan and Nutrition Goals:  Nutrition Therapy & Goals - 07/13/23 1143       Nutrition Therapy   Diet Cardiac, low Na, carb controlled diet    Protein (specify units) 90    Fiber 30 grams    Whole Grain Foods 3 servings    Saturated Fats 15 max. grams    Fruits and Vegetables 5 servings/day    Sodium 2 grams      Personal Nutrition Goals   Nutrition Goal Watch carb sources/portions at meals    Personal Goal #2 reduce sodium content    Comments Patient drinking more water now, encouraged him to continue to do so. He cut out sweet tea ~5weeks ago. Commended him on this change, encouraged him to watch is juice intake as it similar to sweet tea/soda. He says he mostly has it when his sugar drops low. He has a monitor on his arm and reports it has been dropping low throughout the day as of late. Talked to him about eating complex carbs and pairing them with protein/fat. Provided some examples like apple and peanut butter. Reviewed mediterranean diet handout, encouraged smaller portions, reducing sodium/sat fat content, eating less fried processed foods.      Intervention Plan   Intervention Prescribe, educate and counsel regarding individualized specific dietary modifications aiming towards targeted core components such as weight, hypertension, lipid management, diabetes, heart failure and other comorbidities.;Nutrition handout(s) given to patient.    Expected Outcomes Short Term Goal: Understand basic principles of dietary content, such as calories, fat, sodium, cholesterol and nutrients.;Short Term Goal: A plan has been developed with personal nutrition  goals set during dietitian appointment.;Long Term Goal: Adherence to prescribed nutrition plan.  Nutrition Assessments:  MEDIFICTS Score Key: ?70 Need to make dietary changes  40-70 Heart Healthy Diet ? 40 Therapeutic Level Cholesterol Diet  Flowsheet Row Cardiac Rehab from 06/20/2023 in [facility] Cardiac and Pulmonary Rehab  Picture Your Plate Total Score on Admission 46      Picture Your Plate Scores: <28 Unhealthy dietary pattern with much room for improvement. 41-50 Dietary pattern unlikely to meet recommendations for good health and room for improvement. 51-60 More healthful dietary pattern, with some room for improvement.  >60 Healthy dietary pattern, although there may be some specific behaviors that could be improved.    Nutrition Goals Re-Evaluation:   Nutrition Goals Discharge (Final Nutrition Goals Re-Evaluation):   Psychosocial: Target Goals: Acknowledge presence or absence of significant depression and/or stress, maximize coping skills, provide positive support system. Participant is able to verbalize types and ability to use techniques and skills needed for reducing stress and depression.   Education: Stress, Anxiety, and Depression - Group verbal and visual presentation to define topics covered.  Reviews how body is impacted by stress, anxiety, and depression.  Also discusses healthy ways to reduce stress and to treat/manage anxiety and depression.  Written material given at graduation. Flowsheet Row Cardiac Rehab from 08/17/2023 in [facility] Cardiac and Pulmonary Rehab  Date 07/27/23  Educator SB  Instruction Review Code 1- [employer] Understanding       Education: Sleep Hygiene -Provides group verbal and written instruction about how sleep can affect your health.  Define sleep hygiene, discuss sleep cycles and impact of sleep habits. Review good sleep hygiene tips.    Initial Review & Psychosocial Screening:  Initial Psych Review & Screening -  06/07/23 1023       Initial Review   Current issues with Current Stress Concerns    Source of Stress Concerns Financial;Family      Family Dynamics   Good Support System? Yes   wife, daughter lives behind him     Barriers   Psychosocial barriers to participate in program There are no identifiable barriers or psychosocial needs.      Screening Interventions   Interventions To provide support and resources with identified psychosocial needs;Provide feedback about the scores to participant    Expected Outcomes Short Term goal: Utilizing psychosocial counselor, staff and physician to assist with identification of specific Stressors or current issues interfering with healing process. Setting desired goal for each stressor or current issue identified.;Long Term Goal: Stressors or current issues are controlled or eliminated.;Short Term goal: Identification and review with participant of any Quality of Life or Depression concerns found by scoring the questionnaire.;Long Term goal: The participant improves quality of Life and PHQ9 Scores as seen by post scores and/or verbalization of changes             Quality of Life Scores:   Quality of Life - 06/20/23 1523       Quality of Life   Select Quality of Life      Quality of Life Scores   Health/Function Pre 18.27 %    Socioeconomic Pre 18.86 %    Psych/Spiritual Pre 23.36 %    Family Pre 20.4 %    GLOBAL Pre 19.75 %            Scores of 19 and below usually indicate a poorer quality of life in these areas.  A difference of  2-3 points is a clinically meaningful difference.  A difference of 2-3 points in the total score of the Quality of  Life Index has been associated with significant improvement in overall quality of life, self-image, physical symptoms, and general health in studies assessing change in quality of life.  PHQ-9: Review Flowsheet       06/20/2023  Depression screen PHQ 2/9  Decreased Interest 0  Down, Depressed,  Hopeless 0  PHQ - 2 Score 0  Altered sleeping 0  Tired, decreased energy 3  Change in appetite 0  Feeling bad or failure about yourself  0  Trouble concentrating 0  Moving slowly or fidgety/restless 0  Suicidal thoughts 0  PHQ-9 Score 3  Difficult doing work/chores Not difficult at all    Details           Interpretation of Total Score  Total Score Depression Severity:  1-4 = Minimal depression, 5-9 = Mild depression, 10-14 = Moderate depression, 15-19 = Moderately severe depression, 20-27 = Severe depression   Psychosocial Evaluation and Intervention:  Psychosocial Evaluation - 06/07/23 1053       Psychosocial Evaluation & Interventions   Interventions Encouraged to exercise with the program and follow exercise prescription    Comments Suchir has no barriers to attending the program. He wants to increase his stamina and be as healthy as he can. He does have concern with financial and family matters.  His adult son recently had an MI. Quindarious will be attending the same CR sessions as his son.  He is ready to start the program.    Expected Outcomes STG Devaansh attends all scheduled sessions and is able to decrease his conerns about his son's health. He can find resources to help him with his financial concerns. lTG Jeton is able to cintinue with his exercise progression with his son.    Continue Psychosocial Services  Follow up required by staff             Psychosocial Re-Evaluation:  Psychosocial Re-Evaluation     Row Name 07/13/23 1001             Psychosocial Re-Evaluation   Comments Patient reports no issues with their current mental states, sleep, stress, depression or anxiety. Will follow up with patient in a few weeks for any changes.       Expected Outcomes Short: Continue to exercise regularly to support mental health and notify staff of any changes. Long: maintain mental health and well being through teaching of rehab or prescribed medications independently.        Interventions Encouraged to attend Cardiac Rehabilitation for the exercise       Continue Psychosocial Services  Follow up required by staff                Psychosocial Discharge (Final Psychosocial Re-Evaluation):  Psychosocial Re-Evaluation - 07/13/23 1001       Psychosocial Re-Evaluation   Comments Patient reports no issues with their current mental states, sleep, stress, depression or anxiety. Will follow up with patient in a few weeks for any changes.    Expected Outcomes Short: Continue to exercise regularly to support mental health and notify staff of any changes. Long: maintain mental health and well being through teaching of rehab or prescribed medications independently.    Interventions Encouraged to attend Cardiac Rehabilitation for the exercise    Continue Psychosocial Services  Follow up required by staff             Vocational Rehabilitation: Provide vocational rehab assistance to qualifying candidates.   Vocational Rehab Evaluation & Intervention:   Education: Education Goals: Education  classes will be provided on a variety of topics geared toward better understanding of heart health and risk factor modification. Participant will state understanding/return demonstration of topics presented as noted by education test scores.  Learning Barriers/Preferences:   General Cardiac Education Topics:  AED/CPR: - Group verbal and written instruction with the use of models to demonstrate the basic use of the AED with the basic ABC's of resuscitation.   Anatomy and Cardiac Procedures: - Group verbal and visual presentation and models provide information about basic cardiac anatomy and function. Reviews the testing methods done to diagnose heart disease and the outcomes of the test results. Describes the treatment choices: Medical Management, Angioplasty, or Coronary Bypass Surgery for treating various heart conditions including Myocardial Infarction, Angina, Valve  Disease, and Cardiac Arrhythmias.  Written material given at graduation. Flowsheet Row Cardiac Rehab from 08/17/2023 in [facility] Cardiac and Pulmonary Rehab  Education need identified 06/20/23       Medication Safety: - Group verbal and visual instruction to review commonly prescribed medications for heart and lung disease. Reviews the medication, class of the drug, and side effects. Includes the steps to properly store meds and maintain the prescription regimen.  Written material given at graduation. Flowsheet Row Cardiac Rehab from 08/17/2023 in [facility] Cardiac and Pulmonary Rehab  Date 07/06/23  Educator SB  Instruction Review Code 1- Verbalizes Understanding       Intimacy: - Group verbal instruction through game format to discuss how heart and lung disease can affect sexual intimacy. Written material given at graduation..   Know Your Numbers and Heart Failure: - Group verbal and visual instruction to discuss disease risk factors for cardiac and pulmonary disease and treatment options.  Reviews associated critical values for Overweight/Obesity, Hypertension, Cholesterol, and Diabetes.  Discusses basics of heart failure: signs/symptoms and treatments.  Introduces Heart Failure Zone chart for action plan for heart failure.  Written material given at graduation.   Infection Prevention: - Provides verbal and written material to individual with discussion of infection control including proper hand washing and proper equipment cleaning during exercise session. Flowsheet Row Cardiac Rehab from 08/17/2023 in [facility] Cardiac and Pulmonary Rehab  Date 06/20/23  Educator NT  Instruction Review Code 1- Verbalizes Understanding       Falls Prevention: - Provides verbal and written material to individual with discussion of falls prevention and safety. Flowsheet Row Cardiac Rehab from 08/17/2023 in [facility] Cardiac and Pulmonary Rehab  Date 06/07/23  Educator SB  Instruction Review Code 1- Verbalizes  Understanding       Other: -Provides group and verbal instruction on various topics (see comments) Flowsheet Row Cardiac Rehab from 08/17/2023 in [facility] Cardiac and Pulmonary Rehab  Date 06/22/23  Educator Jeopardy SB  Instruction Review Code 1- Verbalizes Understanding       Knowledge Questionnaire Score:  Knowledge Questionnaire Score - 06/20/23 1524       Knowledge Questionnaire Score   Pre Score 21/26             Core Components/Risk Factors/Patient Goals at Admission:  Personal Goals and Risk Factors at Admission - 06/07/23 1025       Core Components/Risk Factors/Patient Goals on Admission   Intervention Weight Management: Develop a combined nutrition and exercise program designed to reach desired caloric intake, while maintaining appropriate intake of nutrient and fiber, sodium and fats, and appropriate energy expenditure required for the weight goal.;Weight Management: Provide education and appropriate resources to help participant work on and attain dietary goals.  Admit Weight 260 lb (117.9 kg)    Goal Weight: Short Term 258 lb (117 kg)    Goal Weight: Long Term 220 lb (99.8 kg)    Expected Outcomes Short Term: Continue to assess and modify interventions until short term weight is achieved;Long Term: Adherence to nutrition and physical activity/exercise program aimed toward attainment of established weight goal;Weight Loss: Understanding of general recommendations for a balanced deficit meal plan, which promotes 1-2 lb weight loss per week and includes a negative energy balance of [phone] kcal/d    Diabetes Yes    Intervention Provide education about signs/symptoms and action to take for hypo/hyperglycemia.;Provide education about proper nutrition, including hydration, and aerobic/resistive exercise prescription along with prescribed medications to achieve blood glucose in normal ranges: Fasting glucose 65-99 mg/dL    Expected Outcomes Short Term: Participant  verbalizes understanding of the signs/symptoms and immediate care of hyper/hypoglycemia, proper foot care and importance of medication, aerobic/resistive exercise and nutrition plan for blood glucose control.;Long Term: Attainment of HbA1C < 7%.    Hypertension Yes    Intervention Provide education on lifestyle modifcations including regular physical activity/exercise, weight management, moderate sodium restriction and increased consumption of fresh fruit, vegetables, and low fat dairy, alcohol moderation, and smoking cessation.;Monitor prescription use compliance.    Expected Outcomes Short Term: Continued assessment and intervention until BP is < 140/58mm HG in hypertensive participants. < 130/21mm HG in hypertensive participants with diabetes, heart failure or chronic kidney disease.;Long Term: Maintenance of blood pressure at goal levels.    Lipids Yes    Intervention Provide education and support for participant on nutrition & aerobic/resistive exercise along with prescribed medications to achieve LDL 70mg , HDL >40mg .    Expected Outcomes Short Term: Participant states understanding of desired cholesterol values and is compliant with medications prescribed. Participant is following exercise prescription and nutrition guidelines.;Long Term: Cholesterol controlled with medications as prescribed, with individualized exercise RX and with personalized nutrition plan. Value goals: LDL < 70mg , HDL > 40 mg.             Education:Diabetes - Individual verbal and written instruction to review signs/symptoms of diabetes, desired ranges of glucose level fasting, after meals and with exercise. Acknowledge that pre and post exercise glucose checks will be done for 3 sessions at entry of program.   Core Components/Risk Factors/Patient Goals Review:   Goals and Risk Factor Review     Row Name 07/13/23 0958             Core Components/Risk Factors/Patient Goals Review   Personal Goals Review Weight  Management/Obesity;Hypertension;Diabetes       Review Patient would like to lose some weight. He is in the process of meeting with the dietitian to go over his eating habits. His blood pressure has been doing well in the program and has no questions about that. He has been checking his sugars routinely and they have been within normal rangres. He would like to reach a weight goal of 220 pounds.       Expected Outcomes Short: lose a few pounds in the next few weeks. Long: reach weight goal.                Core Components/Risk Factors/Patient Goals at Discharge (Final Review):   Goals and Risk Factor Review - 07/13/23 0958       Core Components/Risk Factors/Patient Goals Review   Personal Goals Review Weight Management/Obesity;Hypertension;Diabetes    Review Patient would like to lose some weight. He is in  the process of meeting with the dietitian to go over his eating habits. His blood pressure has been doing well in the program and has no questions about that. He has been checking his sugars routinely and they have been within normal rangres. He would like to reach a weight goal of 220 pounds.    Expected Outcomes Short: lose a few pounds in the next few weeks. Long: reach weight goal.             ITP Comments:  ITP Comments     Row Name 06/07/23 1051 06/20/23 1238 06/22/23 1027 07/04/23 1400 07/25/23 1029   ITP Comments Virtual orientation call completed today. he has an appointment on Date: 06/20/2023  for EP eval and gym Orientation.  Documentation of diagnosis can be found in [facility] Date: 06/05/2023 . Completed and gym orientation. Initial ITP created and sent for review to Dr. Bethann Punches, Medical Director. First full day of exercise!  Patient was oriented to gym and equipment including functions, settings, policies, and procedures.  Patient's individual exercise prescription and treatment plan were reviewed.  All starting workloads were established based on the results of the 6  minute walk test done at initial orientation visit.  The plan for exercise progression was also introduced and progression will be customized based on patient's performance and goals. 30 Day review completed. Medical Director ITP review done, changes made as directed, and signed approval by Medical Director.   new to program Monjoro stopped and insulin resumed    Row Name 08/02/23 1123 08/30/23 0935         ITP Comments 30 Day review completed. Medical Director ITP review done, changes made as directed, and signed approval by Medical Director. 30 Day review completed. Medical Director ITP review done, changes made as directed, and signed approval by Medical Director.               Comments:

## 2023-08-31 ENCOUNTER — Encounter: Payer: Medicare PPO | Admitting: *Deleted

## 2023-08-31 DIAGNOSIS — Z955 Presence of coronary angioplasty implant and graft: Secondary | ICD-10-CM | POA: Diagnosis not present

## 2023-08-31 DIAGNOSIS — E1165 Type 2 diabetes mellitus with hyperglycemia: Secondary | ICD-10-CM | POA: Diagnosis not present

## 2023-08-31 NOTE — Progress Notes (Signed)
Daily Session Note  Patient Details  Name: Nicholas Acevedo MRN: [id number] Date of Birth: [birth year] Referring Provider:   Flowsheet Row Cardiac Rehab from 06/20/2023 in [facility] Cardiac and Pulmonary Rehab  Referring Provider Dr. Massie Maroon, MD       Encounter Date: 08/31/2023  Check In:  Session Check In - 08/31/23 1036       Check-In   Supervising physician immediately available to respond to emergencies See telemetry face sheet for immediately available ER MD    Location [facility]-Cardiac & Pulmonary Rehab    Staff Present Cora Collum, RN, BSN, CCRP;Margaret Best, MS, Exercise Physiologist;Meredith Jewel Baize, RN Atilano Median, RN, ADN    Virtual Visit No    Medication changes reported     No    Fall or balance concerns reported    No    Warm-up and Cool-down Performed on first and last piece of equipment    Resistance Training Performed Yes    VAD Patient? No    PAD/SET Patient? No      Pain Assessment   Currently in Pain? No/denies                Social History   Tobacco Use  Smoking Status Never  Smokeless Tobacco Never    Goals Met:  Independence with exercise equipment Exercise tolerated well No report of concerns or symptoms today Strength training completed today  Goals Unmet:  Not Applicable  Comments: Pt able to follow exercise prescription today without complaint.  Will continue to monitor for progression.    Dr. Bethann Punches is Medical Director for [facility] Cardiac Rehabilitation.  Dr. Vida Rigger is Medical Director for [facility] Pulmonary Rehabilitation.

## 2023-09-05 ENCOUNTER — Encounter: Payer: Medicare PPO | Admitting: *Deleted

## 2023-09-05 DIAGNOSIS — Z955 Presence of coronary angioplasty implant and graft: Secondary | ICD-10-CM | POA: Diagnosis not present

## 2023-09-05 NOTE — Progress Notes (Signed)
Daily Session Note  Patient Details  Name: Nicholas Acevedo MRN: [id number] Date of Birth: [birth year] Referring Provider:   Flowsheet Row Cardiac Rehab from 06/20/2023 in [facility] Cardiac and Pulmonary Rehab  Referring Provider Dr. Massie Maroon, MD       Encounter Date: 09/05/2023  Check In:  Session Check In - 09/05/23 1038       Check-In   Supervising physician immediately available to respond to emergencies See telemetry face sheet for immediately available ER MD    Location [facility]-Cardiac & Pulmonary Rehab    Staff Present Cora Collum, RN, BSN, CCRP;Noah Tickle, BS, Exercise Physiologist;Meredith Jewel Baize, RN BSN;Maxon Conetta BS, , Exercise Physiologist    Virtual Visit No    Medication changes reported     No    Fall or balance concerns reported    No    Warm-up and Cool-down Performed on first and last piece of equipment    Resistance Training Performed Yes    VAD Patient? No    PAD/SET Patient? No      Pain Assessment   Currently in Pain? No/denies                Social History   Tobacco Use  Smoking Status Never  Smokeless Tobacco Never    Goals Met:  Independence with exercise equipment Exercise tolerated well No report of concerns or symptoms today  Goals Unmet:  Not Applicable  Comments: Pt able to follow exercise prescription today without complaint.  Will continue to monitor for progression.    Dr. Bethann Punches is Medical Director for [facility] Cardiac Rehabilitation.  Dr. Vida Rigger is Medical Director for [facility] Pulmonary Rehabilitation.

## 2023-09-07 ENCOUNTER — Encounter: Payer: Medicare PPO | Admitting: *Deleted

## 2023-09-07 DIAGNOSIS — Z955 Presence of coronary angioplasty implant and graft: Secondary | ICD-10-CM

## 2023-09-07 NOTE — Progress Notes (Signed)
Daily Session Note  Patient Details  Name: Nicholas Acevedo MRN: [id number] Date of Birth: 03-20-1946 Referring Provider:   Flowsheet Row Cardiac Rehab from 06/20/2023 in [facility] Cardiac and Pulmonary Rehab  Referring Provider Dr. Massie Maroon, MD       Encounter Date: 09/07/2023  Check In:  Session Check In - 09/07/23 1029       Check-In   Supervising physician immediately available to respond to emergencies See telemetry face sheet for immediately available ER MD    Location [facility]-Cardiac & Pulmonary Rehab    Staff Present Lanny Hurst, RN, ADN;Maxon Conetta BS, , Exercise Physiologist;Noah Tickle, BS, Exercise Physiologist    Virtual Visit No    Medication changes reported     No    Fall or balance concerns reported    No    Warm-up and Cool-down Performed on first and last piece of equipment    Resistance Training Performed Yes    VAD Patient? No    PAD/SET Patient? No      Pain Assessment   Currently in Pain? No/denies                Social History   Tobacco Use  Smoking Status Never  Smokeless Tobacco Never    Goals Met:  Independence with exercise equipment Exercise tolerated well No report of concerns or symptoms today Strength training completed today  Goals Unmet:  Not Applicable  Comments: Pt able to follow exercise prescription today without complaint.  Will continue to monitor for progression.    Dr. Bethann Punches is Medical Director for [facility] Cardiac Rehabilitation.  Dr. Vida Rigger is Medical Director for [facility] Pulmonary Rehabilitation.

## 2023-09-08 DIAGNOSIS — H401133 Primary open-angle glaucoma, bilateral, severe stage: Secondary | ICD-10-CM | POA: Diagnosis not present

## 2023-09-12 ENCOUNTER — Encounter: Payer: Medicare PPO | Admitting: *Deleted

## 2023-09-12 DIAGNOSIS — Z955 Presence of coronary angioplasty implant and graft: Secondary | ICD-10-CM | POA: Diagnosis not present

## 2023-09-12 NOTE — Progress Notes (Signed)
Daily Session Note  Patient Details  Name: Nicholas Acevedo MRN: [id number] Date of Birth: [birth year] Referring Provider:   Flowsheet Row Cardiac Rehab from 06/20/2023 in [facility] Cardiac and Pulmonary Rehab  Referring Provider Dr. Massie Maroon, MD       Encounter Date: 09/12/2023  Check In:  Session Check In - 09/12/23 1139       Check-In   Supervising physician immediately available to respond to emergencies See telemetry face sheet for immediately available ER MD    Location [facility]-Cardiac & Pulmonary Rehab    Staff Present Cora Collum, RN, BSN, CCRP;Meredith Jewel Baize, RN BSN;Maxon Conetta BS, , Exercise Physiologist;Noah Tickle, BS, Exercise Physiologist    Virtual Visit No    Medication changes reported     No    Fall or balance concerns reported    No    Warm-up and Cool-down Performed on first and last piece of equipment    Resistance Training Performed Yes    VAD Patient? No    PAD/SET Patient? No      Pain Assessment   Currently in Pain? No/denies                Social History   Tobacco Use  Smoking Status Never  Smokeless Tobacco Never    Goals Met:  Independence with exercise equipment Exercise tolerated well No report of concerns or symptoms today  Goals Unmet:  Not Applicable  Comments: Pt able to follow exercise prescription today without complaint.  Will continue to monitor for progression.    Dr. Bethann Punches is Medical Director for [facility] Cardiac Rehabilitation.  Dr. Vida Rigger is Medical Director for [facility] Pulmonary Rehabilitation.

## 2023-09-14 ENCOUNTER — Encounter: Payer: Medicare PPO | Admitting: *Deleted

## 2023-09-14 DIAGNOSIS — Z955 Presence of coronary angioplasty implant and graft: Secondary | ICD-10-CM

## 2023-09-14 NOTE — Progress Notes (Signed)
Daily Session Note  Patient Details  Name: Nicholas Acevedo MRN: [id number] Date of Birth: [birth year] Referring Provider:   Flowsheet Row Cardiac Rehab from 06/20/2023 in [facility] Cardiac and Pulmonary Rehab  Referring Provider Dr. Massie Maroon, MD       Encounter Date: 09/14/2023  Check In:  Session Check In - 09/14/23 1017       Check-In   Supervising physician immediately available to respond to emergencies See telemetry face sheet for immediately available ER MD    Location [facility]-Cardiac & Pulmonary Rehab    Staff Present Ronette Deter, BS, Exercise Physiologist;Meredith Jewel Baize, RN BSN;Maxon Conetta BS, , Exercise Physiologist;Colby Reels Katrinka Blazing, RN, ADN    Virtual Visit No    Medication changes reported     No    Fall or balance concerns reported    No    Warm-up and Cool-down Performed on first and last piece of equipment    Resistance Training Performed Yes    VAD Patient? No    PAD/SET Patient? No      Pain Assessment   Currently in Pain? No/denies                Social History   Tobacco Use  Smoking Status Never  Smokeless Tobacco Never    Goals Met:  Independence with exercise equipment Exercise tolerated well No report of concerns or symptoms today Strength training completed today  Goals Unmet:  Not Applicable  Comments: Pt able to follow exercise prescription today without complaint.  Will continue to monitor for progression.    Dr. Bethann Punches is Medical Director for [facility] Cardiac Rehabilitation.  Dr. Vida Rigger is Medical Director for [facility] Pulmonary Rehabilitation.

## 2023-09-21 ENCOUNTER — Encounter: Payer: Medicare PPO | Admitting: *Deleted

## 2023-09-21 VITALS — Ht 72.0 in | Wt 250.8 lb

## 2023-09-21 DIAGNOSIS — Z955 Presence of coronary angioplasty implant and graft: Secondary | ICD-10-CM | POA: Diagnosis not present

## 2023-09-21 NOTE — Patient Instructions (Signed)
Discharge Patient Instructions  Patient Details  Name: Nicholas Acevedo MRN: [id number] Date of Birth: 03-27-1946 Referring Provider:  Marina Goodell, MD   Number of Visits: 32  Reason for Discharge:  Patient reached a stable level of exercise. Patient independent in their exercise. Patient has met program and personal goals.  Diagnosis:  No diagnosis found.  Initial Exercise Prescription:  Initial Exercise Prescription - 06/20/23 1500       Date of Initial Exercise RX and Referring Provider   Date 06/20/23    Referring Provider Dr. Massie Maroon, MD      Oxygen   Maintain Oxygen Saturation 88% or higher      Recumbant Bike   Level 1    RPM 50    Watts 15    Minutes 15    METs 1.92      NuStep   Level 2    SPM 80    Minutes 15    METs 1.92      Biostep-RELP   Level 1    SPM 50    Minutes 15    METs 1.92      Prescription Details   Frequency (times per week) 2    Duration Progress to 30 minutes of continuous aerobic without signs/symptoms of physical distress      Intensity   THRR 40-80% of Max Heartrate 99-128    Ratings of Perceived Exertion 11-13    Perceived Dyspnea 0-4      Progression   Progression Continue to progress workloads to maintain intensity without signs/symptoms of physical distress.      Resistance Training   Training Prescription Yes    Weight 4 lb    Reps 10-15             Discharge Exercise Prescription (Final Exercise Prescription Changes):  Exercise Prescription Changes - 09/12/23 1500       Response to Exercise   Blood Pressure (Admit) 132/72    Blood Pressure (Exit) 122/56    Heart Rate (Admit) 63 bpm    Heart Rate (Exercise) 125 bpm    Heart Rate (Exit) 102 bpm    Rating of Perceived Exertion (Exercise) 13    Symptoms none    Duration Progress to 30 minutes of  aerobic without signs/symptoms of physical distress    Intensity THRR unchanged      Progression   Progression Continue to progress workloads to  maintain intensity without signs/symptoms of physical distress.    Average METs 3.83      Resistance Training   Training Prescription Yes    Weight 5 lb    Reps 10-15      Interval Training   Interval Training No      NuStep   Level 5    Minutes 15    METs 4.9      Track   Laps 60    Minutes 30    METs 4.27      Oxygen   Maintain Oxygen Saturation 88% or higher             Functional Capacity:  6 Minute Walk     Row Name 06/20/23 1505 09/21/23 1023       6 Minute Walk   Phase Initial Discharge    Distance 1130 feet 1370 feet    Distance % Change -- 21 %    Distance Feet Change -- 240 ft    Walk Time 6 minutes 6 minutes    #  of Rest Breaks 0 0    MPH 2.14 2.6    METS 1.92 2.5    RPE 11 11    Perceived Dyspnea  1 0    VO2 Peak 6.71 8.8    Symptoms No No    Resting HR 71 bpm 75 bpm    Resting BP 126/74 120/60    Resting Oxygen Saturation  97 % 94 %    Exercise Oxygen Saturation  during 6 min walk 95 % 92 %    Max Ex. HR 104 bpm 100 bpm    Max Ex. BP 144/68 162/66    2 Minute Post BP 136/74 --            Nutrition & Weight - Outcomes:  Pre Biometrics - 06/20/23 1507       Pre Biometrics   Height 5\' 11"  (1.803 m)    Weight 260 lb (117.9 kg)    Waist Circumference 47.5 inches    Hip Circumference 45.5 inches    Waist to Hip Ratio 1.04 %    BMI (Calculated) 36.28    Single Leg Stand 3.35 seconds             Post Biometrics - 09/21/23 1028        Post  Biometrics   Height 6' (1.829 m)    Weight 250 lb 12.8 oz (113.8 kg)    Waist Circumference 46.5 inches    Hip Circumference 43.2 inches    Waist to Hip Ratio 1.08 %    BMI (Calculated) 34.01    Single Leg Stand 4.98 seconds             Nutrition:  Nutrition Therapy & Goals - 07/13/23 1143       Nutrition Therapy   Diet Cardiac, low Na, carb controlled diet    Protein (specify units) 90    Fiber 30 grams    Whole Grain Foods 3 servings    Saturated Fats 15 max. grams     Fruits and Vegetables 5 servings/day    Sodium 2 grams      Personal Nutrition Goals   Nutrition Goal Watch carb sources/portions at meals    Personal Goal #2 reduce sodium content    Comments Patient drinking more water now, encouraged him to continue to do so. He cut out sweet tea ~5weeks ago. Commended him on this change, encouraged him to watch is juice intake as it similar to sweet tea/soda. He says he mostly has it when his sugar drops low. He has a monitor on his arm and reports it has been dropping low throughout the day as of late. Talked to him about eating complex carbs and pairing them with protein/fat. Provided some examples like apple and peanut butter. Reviewed mediterranean diet handout, encouraged smaller portions, reducing sodium/sat fat content, eating less fried processed foods.      Intervention Plan   Intervention Prescribe, educate and counsel regarding individualized specific dietary modifications aiming towards targeted core components such as weight, hypertension, lipid management, diabetes, heart failure and other comorbidities.;Nutrition handout(s) given to patient.    Expected Outcomes Short Term Goal: Understand basic principles of dietary content, such as calories, fat, sodium, cholesterol and nutrients.;Short Term Goal: A plan has been developed with personal nutrition goals set during dietitian appointment.;Long Term Goal: Adherence to prescribed nutrition plan.            Goals reviewed with patient; copy given to patient.

## 2023-09-21 NOTE — Progress Notes (Signed)
Daily Session Note  Patient Details  Name: Nicholas Acevedo MRN: [id number] Date of Birth: 1946/09/12 Referring Provider:   Flowsheet Row Cardiac Rehab from 06/20/2023 in [facility] Cardiac and Pulmonary Rehab  Referring Provider Dr. Massie Maroon, MD       Encounter Date: 09/21/2023  Check In:  Session Check In - 09/21/23 1036       Check-In   Supervising physician immediately available to respond to emergencies See telemetry face sheet for immediately available ER MD    Location [facility]-Cardiac & Pulmonary Rehab    Staff Present Hulen Luster, BS, RRT, CPFT;Maxon Conetta BS, , Exercise Physiologist;Wonda Goodgame Katrinka Blazing, RN, ADN;Meredith Jewel Baize, RN BSN    Virtual Visit No    Medication changes reported     No    Fall or balance concerns reported    No    Warm-up and Cool-down Performed on first and last piece of equipment    Resistance Training Performed Yes    VAD Patient? No    PAD/SET Patient? No      Pain Assessment   Currently in Pain? No/denies                Social History   Tobacco Use  Smoking Status Never  Smokeless Tobacco Never    Goals Met:  Independence with exercise equipment Exercise tolerated well No report of concerns or symptoms today Strength training completed today  Goals Unmet:  Not Applicable  Comments: Pt able to follow exercise prescription today without complaint.  Will continue to monitor for progression.   6 Minute Walk     Row Name 06/20/23 1505 09/21/23 1023       6 Minute Walk   Phase Initial Discharge    Distance 1130 feet 1370 feet    Distance % Change -- 21 %    Distance Feet Change -- 240 ft    Walk Time 6 minutes 6 minutes    # of Rest Breaks 0 0    MPH 2.14 2.6    METS 1.92 2.5    RPE 11 11    Perceived Dyspnea  1 0    VO2 Peak 6.71 8.8    Symptoms No No    Resting HR 71 bpm 75 bpm    Resting BP 126/74 120/60    Resting Oxygen Saturation  97 % 94 %    Exercise Oxygen Saturation  during 6 min walk 95 % 92 %    Max Ex. HR  104 bpm 100 bpm    Max Ex. BP 144/68 162/66    2 Minute Post BP 136/74 --               Dr. Bethann Punches is Medical Director for [facility] Cardiac Rehabilitation.  Dr. Vida Rigger is Medical Director for [facility] Pulmonary Rehabilitation.

## 2023-09-22 ENCOUNTER — Ambulatory Visit: Payer: Medicare PPO

## 2023-09-26 ENCOUNTER — Encounter: Payer: Medicare PPO | Attending: Cardiology | Admitting: *Deleted

## 2023-09-26 DIAGNOSIS — Z48812 Encounter for surgical aftercare following surgery on the circulatory system: Secondary | ICD-10-CM | POA: Diagnosis not present

## 2023-09-26 DIAGNOSIS — Z955 Presence of coronary angioplasty implant and graft: Secondary | ICD-10-CM | POA: Diagnosis not present

## 2023-09-26 NOTE — Progress Notes (Signed)
Daily Session Note  Patient Details  Name: Nicholas Acevedo MRN: [id number] Date of Birth: [birth year] Referring Provider:   Flowsheet Row Cardiac Rehab from 06/20/2023 in [facility] Cardiac and Pulmonary Rehab  Referring Provider Dr. Massie Maroon, MD       Encounter Date: 09/26/2023  Check In:  Session Check In - 09/26/23 1038       Check-In   Supervising physician immediately available to respond to emergencies See telemetry face sheet for immediately available ER MD    Location [facility]-Cardiac & Pulmonary Rehab    Staff Present Cora Collum, RN, BSN, CCRP;Noah Tickle, BS, Exercise Physiologist;Maxon Conetta BS, , Exercise Physiologist    Virtual Visit No    Medication changes reported     No    Fall or balance concerns reported    No    Warm-up and Cool-down Performed on first and last piece of equipment    Resistance Training Performed Yes    VAD Patient? No    PAD/SET Patient? No      Pain Assessment   Currently in Pain? No/denies                Social History   Tobacco Use  Smoking Status Never  Smokeless Tobacco Never    Goals Met:  Independence with exercise equipment Exercise tolerated well No report of concerns or symptoms today  Goals Unmet:  Not Applicable  Comments: Pt able to follow exercise prescription today without complaint.  Will continue to monitor for progression.    Dr. Bethann Punches is Medical Director for [facility] Cardiac Rehabilitation.  Dr. Vida Rigger is Medical Director for [facility] Pulmonary Rehabilitation.

## 2023-09-27 ENCOUNTER — Encounter: Payer: Self-pay | Admitting: *Deleted

## 2023-09-27 DIAGNOSIS — Z955 Presence of coronary angioplasty implant and graft: Secondary | ICD-10-CM

## 2023-09-27 NOTE — Progress Notes (Signed)
Cardiac Individual Treatment Plan  Patient Details  Name: Nicholas Acevedo MRN: [id number] Date of Birth: [birth year] Referring Provider:   Flowsheet Row Cardiac Rehab from 06/20/2023 in [facility] Cardiac and Pulmonary Rehab  Referring Provider Dr. Massie Maroon, MD       Initial Encounter Date:  Flowsheet Row Cardiac Rehab from 06/20/2023 in [facility] Cardiac and Pulmonary Rehab  Date 06/20/23       Visit Diagnosis: Status post coronary artery stent placement  Patient's Home Medications on Admission:  Current Outpatient Medications:    ascorbic acid (VITAMIN C) 500 MG tablet, Take by mouth. (Patient not taking: Reported on 06/07/2023), Disp: , Rfl:    aspirin EC 81 MG tablet, Take 81 mg by mouth daily., Disp: , Rfl:    atorvastatin (LIPITOR) 40 MG tablet, Take 40 mg by mouth daily., Disp: , Rfl:    brimonidine (ALPHAGAN) 0.2 % ophthalmic solution, Place 1 drop into both eyes 3 (three) times daily., Disp: , Rfl:    Cholecalciferol 50 MCG (2000 UT) CAPS, Take by mouth. (Patient not taking: Reported on 06/07/2023), Disp: , Rfl:    clopidogrel (PLAVIX) 75 MG tablet, Take 75 mg by mouth daily., Disp: , Rfl:    cyanocobalamin 1000 MCG tablet, Take by mouth. (Patient not taking: Reported on 06/07/2023), Disp: , Rfl:    dorzolamide-timolol (COSOPT) 22.3-6.8 MG/ML ophthalmic solution, , Disp: , Rfl:    Dulaglutide 0.75 MG/0.5ML SOPN, Inject into the skin. (Patient not taking: Reported on 06/07/2023), Disp: , Rfl:    empagliflozin (JARDIANCE) 25 MG TABS tablet, Take 25 mg by mouth daily. 25 mg tab take half a tab (12.5mg )  daily, Disp: , Rfl:    glipiZIDE (GLUCOTROL) 5 MG tablet, TAKE 1 TABLET BY MOUTH TWICE DAILY BEFORE MEAL(S), Disp: , Rfl:    hydrocortisone 2.5 % cream, Use as directed. Instructions in handout, Disp: 30 g, Rfl: 3   LANTUS SOLOSTAR 100 UNIT/ML Solostar Pen, Inject 45 Units into the skin daily. AM, Disp: , Rfl:    latanoprost (XALATAN) 0.005 % ophthalmic solution, 1 drop at bedtime. (Patient  not taking: Reported on 06/07/2023), Disp: , Rfl:    losartan (COZAAR) 50 MG tablet, Take 50 mg by mouth daily., Disp: , Rfl:    metFORMIN (GLUCOPHAGE) 1000 MG tablet, Take 1,000 mg by mouth 2 (two) times daily with a meal., Disp: , Rfl:    MOUNJARO 5 MG/0.5ML Pen, Inject 5 mg into the skin once a week. (Patient not taking: Reported on 07/18/2023), Disp: , Rfl:    Netarsudil-Latanoprost (ROCKLATAN) 0.02-0.005 % SOLN, 1 drop nightly, Disp: , Rfl:    omeprazole (PRILOSEC OTC) 20 MG tablet, Take by mouth., Disp: , Rfl:    omeprazole (PRILOSEC) 40 MG capsule, Take 40 mg by mouth daily. (Patient not taking: Reported on 06/07/2023), Disp: , Rfl:    ONE TOUCH ULTRA TEST test strip, USE ONE STRIP TO CHECK GLUCOSE ONCE DAILY, Disp: , Rfl: 3   sildenafil (REVATIO) 20 MG tablet, 2-5 tabs 1 hour prior to intercourse (Patient not taking: Reported on 06/07/2023), Disp: 15 tablet, Rfl: 0   tamsulosin (FLOMAX) 0.4 MG CAPS capsule, Take 1 capsule (0.4 mg total) by mouth daily., Disp: 90 capsule, Rfl: 3  Past Medical History: Past Medical History:  Diagnosis Date   Actinic keratosis 10/31/2007   R forearm - bx proven    Basal cell carcinoma 03/15/2007   L alar groove    Basal cell carcinoma 08/20/2018   L upper med clavicle  BCC (basal cell carcinoma of skin) 11/23/2021   Left lower cheek above jaw, EDC   Cancer (HCC)    prostate   Diabetes mellitus without complication (HCC)    ED (erectile dysfunction)    Esophagitis    GERD (gastroesophageal reflux disease)    Glaucoma    Hyperlipemia    Hypertension    Impotence of organic origin    Sleep apnea    Urinary obstruction     Tobacco Use: Social History   Tobacco Use  Smoking Status Never  Smokeless Tobacco Never    Labs: Review Flowsheet        No data to display           Exercise Target Goals: Exercise Program Goal: Individual exercise prescription set using results from initial 6 min walk test and THRR while considering   patient's activity barriers and safety.   Exercise Prescription Goal: Initial exercise prescription builds to 30-45 minutes a day of aerobic activity, 2-3 days per week.  Home exercise guidelines will be given to patient during program as part of exercise prescription that the participant will acknowledge.   Education: Aerobic Exercise: - Group verbal and visual presentation on the components of exercise prescription. Introduces F.I.T.T principle from ACSM for exercise prescriptions.  Reviews F.I.T.T. principles of aerobic exercise including progression. Written material given at graduation. Flowsheet Row Cardiac Rehab from 09/07/2023 in [facility] Cardiac and Pulmonary Rehab  Education need identified 06/20/23       Education: Resistance Exercise: - Group verbal and visual presentation on the components of exercise prescription. Introduces F.I.T.T principle from ACSM for exercise prescriptions  Reviews F.I.T.T. principles of resistance exercise including progression. Written material given at graduation.    Education: Exercise & Equipment Safety: - Individual verbal instruction and demonstration of equipment use and safety with use of the equipment. Flowsheet Row Cardiac Rehab from 09/07/2023 in [facility] Cardiac and Pulmonary Rehab  Date 06/20/23  Educator NT  Instruction Review Code 1- Verbalizes Understanding       Education: Exercise Physiology & General Exercise Guidelines: - Group verbal and written instruction with models to review the exercise physiology of the cardiovascular system and associated critical values. Provides general exercise guidelines with specific guidelines to those with heart or lung disease.    Education: Flexibility, Balance, Mind/Body Relaxation: - Group verbal and visual presentation with interactive activity on the components of exercise prescription. Introduces F.I.T.T principle from ACSM for exercise prescriptions. Reviews F.I.T.T. principles of flexibility and  balance exercise training including progression. Also discusses the mind body connection.  Reviews various relaxation techniques to help reduce and manage stress (i.e. Deep breathing, progressive muscle relaxation, and visualization). Balance handout provided to take home. Written material given at graduation. Flowsheet Row Cardiac Rehab from 09/07/2023 in [facility] Cardiac and Pulmonary Rehab  Date 08/17/23  Educator NT  Instruction Review Code 1- Verbalizes Understanding       Activity Barriers & Risk Stratification:  Activity Barriers & Cardiac Risk Stratification - 06/20/23 1506       Activity Barriers & Cardiac Risk Stratification   Activity Barriers Other (comment)    Comments Glaucoma impedes vision    Cardiac Risk Stratification Moderate             6 Minute Walk:  6 Minute Walk     Row Name 06/20/23 1505 09/21/23 1023       6 Minute Walk   Phase Initial Discharge    Distance 1130 feet 1370 feet  Distance % Change -- 21 %    Distance Feet Change -- 240 ft    Walk Time 6 minutes 6 minutes    # of Rest Breaks 0 0    MPH 2.14 2.6    METS 1.92 2.5    RPE 11 11    Perceived Dyspnea  1 0    VO2 Peak 6.71 8.8    Symptoms No No    Resting HR 71 bpm 75 bpm    Resting BP 126/74 120/60    Resting Oxygen Saturation  97 % 94 %    Exercise Oxygen Saturation  during 6 min walk 95 % 92 %    Max Ex. HR 104 bpm 100 bpm    Max Ex. BP 144/68 162/66    2 Minute Post BP 136/74 --             Oxygen Initial Assessment:   Oxygen Re-Evaluation:   Oxygen Discharge (Final Oxygen Re-Evaluation):   Initial Exercise Prescription:  Initial Exercise Prescription - 06/20/23 1500       Date of Initial Exercise RX and Referring Provider   Date 06/20/23    Referring Provider Dr. Massie Maroon, MD      Oxygen   Maintain Oxygen Saturation 88% or higher      Recumbant Bike   Level 1    RPM 50    Watts 15    Minutes 15    METs 1.92      NuStep   Level 2    SPM 80     Minutes 15    METs 1.92      Biostep-RELP   Level 1    SPM 50    Minutes 15    METs 1.92      Prescription Details   Frequency (times per week) 2    Duration Progress to 30 minutes of continuous aerobic without signs/symptoms of physical distress      Intensity   THRR 40-80% of Max Heartrate 99-128    Ratings of Perceived Exertion 11-13    Perceived Dyspnea 0-4      Progression   Progression Continue to progress workloads to maintain intensity without signs/symptoms of physical distress.      Resistance Training   Training Prescription Yes    Weight 4 lb    Reps 10-15             Perform Capillary Blood Glucose checks as needed.  Exercise Prescription Changes:   Exercise Prescription Changes     Row Name 06/20/23 1500 06/22/23 1000 07/19/23 0700 08/02/23 1500 08/15/23 1300     Response to Exercise   Blood Pressure (Admit) 126/74 126/74 122/78 142/72 122/60   Blood Pressure (Exercise) 144/68 144/68 168/80 148/74 142/64   Blood Pressure (Exit) 136/74 136/74 140/80 124/62 116/60   Heart Rate (Admit) 71 bpm 71 bpm 64 bpm 66 bpm 60 bpm   Heart Rate (Exercise) 104 bpm 104 bpm 118 bpm 119 bpm 127 bpm   Heart Rate (Exit) 87 bpm 87 bpm 97 bpm 102 bpm 101 bpm   Oxygen Saturation (Admit) 97 % 97 % -- -- --   Oxygen Saturation (Exercise) 95 % 95 % -- -- --   Rating of Perceived Exertion (Exercise) 11 11 13 14 13    Perceived Dyspnea (Exercise) 1 1 -- -- --   Symptoms None None -- none none   Comments Results Results -- -- --   Duration -- -- --  Progress to 30 minutes of  aerobic without signs/symptoms of physical distress Progress to 30 minutes of  aerobic without signs/symptoms of physical distress   Intensity -- -- -- THRR unchanged THRR unchanged     Progression   Progression -- -- -- Continue to progress workloads to maintain intensity without signs/symptoms of physical distress. Continue to progress workloads to maintain intensity without signs/symptoms of  physical distress.   Average METs -- -- 3.23 3.44 3.04     Resistance Training   Training Prescription -- Yes Yes Yes Yes   Weight -- 4 lb 4 lb 4 lb 5   Reps -- 10-15 10-15 10-15 10-15     Interval Training   Interval Training -- -- -- No No     Recumbant Bike   Level -- 1 1 -- --   RPM -- 50 -- -- --   Watts -- 15 15 -- --   Minutes -- 15 15 -- --   METs -- 1.92 2.4 -- --     NuStep   Level -- 2 3 6 4    SPM -- 80 -- -- --   Minutes -- 15 15 15 15    METs -- 1.92 3.6 3.9 4.3     T5 Nustep   Level -- -- -- -- 1  T6   Minutes -- -- -- -- 15   METs -- -- -- -- 2.9     Biostep-RELP   Level -- 1 3 -- --   SPM -- 50 -- -- --   Minutes -- 15 15 -- --   METs -- 1.92 3 -- --     Track   Laps -- -- -- 30 35   Minutes -- -- -- 15 15   METs -- -- -- 2.63 2.9     Oxygen   Maintain Oxygen Saturation -- -- 88% or higher 88% or higher 88% or higher    Row Name 08/29/23 1500 09/12/23 1500           Response to Exercise   Blood Pressure (Admit) 138/66 132/72      Blood Pressure (Exit) 122/64 122/56      Heart Rate (Admit) 67 bpm 63 bpm      Heart Rate (Exercise) 123 bpm 125 bpm      Heart Rate (Exit) 100 bpm 102 bpm      Rating of Perceived Exertion (Exercise) 14 13      Symptoms none none      Duration Progress to 30 minutes of  aerobic without signs/symptoms of physical distress Progress to 30 minutes of  aerobic without signs/symptoms of physical distress      Intensity THRR unchanged THRR unchanged        Progression   Progression Continue to progress workloads to maintain intensity without signs/symptoms of physical distress. Continue to progress workloads to maintain intensity without signs/symptoms of physical distress.      Average METs 3.44 3.83        Resistance Training   Training Prescription Yes Yes      Weight 5 5 lb      Reps 10-15 10-15        Interval Training   Interval Training No No        NuStep   Level 5 5      Minutes 15 15      METs 3.8  4.9        Track   Laps 45 60  Minutes 15 30      METs 3.45 4.27        Oxygen   Maintain Oxygen Saturation 88% or higher 88% or higher               Exercise Comments:   Exercise Comments     Row Name 06/22/23 1028           Exercise Comments First full day of exercise!  Patient was oriented to gym and equipment including functions, settings, policies, and procedures.  Patient's individual exercise prescription and treatment plan were reviewed.  All starting workloads were established based on the results of the 6 minute walk test done at initial orientation visit.  The plan for exercise progression was also introduced and progression will be customized based on patient's performance and goals.                Exercise Goals and Review:   Exercise Goals     Row Name 06/20/23 1239             Exercise Goals   Increase Physical Activity Yes       Intervention Develop an individualized exercise prescription for aerobic and resistive training based on initial evaluation findings, risk stratification, comorbidities and participant's personal goals.;Provide advice, education, support and counseling about physical activity/exercise needs.       Expected Outcomes Short Term: Attend rehab on a regular basis to increase amount of physical activity.;Long Term: Exercising regularly at least 3-5 days a week.;Long Term: Add in home exercise to make exercise part of routine and to increase amount of physical activity.       Increase Strength and Stamina Yes       Intervention Provide advice, education, support and counseling about physical activity/exercise needs.;Develop an individualized exercise prescription for aerobic and resistive training based on initial evaluation findings, risk stratification, comorbidities and participant's personal goals.       Expected Outcomes Short Term: Increase workloads from initial exercise prescription for resistance, speed, and METs.;Short  Term: Perform resistance training exercises routinely during rehab and add in resistance training at home;Long Term: Improve cardiorespiratory fitness, muscular endurance and strength as measured by increased METs and functional capacity ( )       Able to understand and use rate of perceived exertion (RPE) scale Yes       Intervention Provide education and explanation on how to use RPE scale       Expected Outcomes Short Term: Able to use RPE daily in rehab to express subjective intensity level;Long Term:  Able to use RPE to guide intensity level when exercising independently       Able to understand and use Dyspnea scale Yes       Intervention Provide education and explanation on how to use Dyspnea scale       Expected Outcomes Short Term: Able to use Dyspnea scale daily in rehab to express subjective sense of shortness of breath during exertion;Long Term: Able to use Dyspnea scale to guide intensity level when exercising independently       Knowledge and understanding of Target Heart Rate Range (THRR) Yes       Intervention Provide education and explanation of THRR including how the numbers were predicted and where they are located for reference       Expected Outcomes Short Term: Able to state/look up THRR;Long Term: Able to use THRR to govern intensity when exercising independently;Short Term: Able to use daily as guideline for intensity  in rehab       Able to check pulse independently Yes       Intervention Provide education and demonstration on how to check pulse in carotid and radial arteries.;Review the importance of being able to check your own pulse for safety during independent exercise       Expected Outcomes Short Term: Able to explain why pulse checking is important during independent exercise;Long Term: Able to check pulse independently and accurately       Understanding of Exercise Prescription Yes       Intervention Provide education, explanation, and written materials on patient's  individual exercise prescription       Expected Outcomes Short Term: Able to explain program exercise prescription;Long Term: Able to explain home exercise prescription to exercise independently                Exercise Goals Re-Evaluation :  Exercise Goals Re-Evaluation     Row Name 06/22/23 1028 07/19/23 0800 08/02/23 1522 08/15/23 1346 08/29/23 1528     Exercise Goal Re-Evaluation   Exercise Goals Review Able to understand and use rate of perceived exertion (RPE) scale;Able to understand and use Dyspnea scale;Knowledge and understanding of Target Heart Rate Range (THRR);Understanding of Exercise Prescription Understanding of Exercise Prescription;Increase Physical Activity Understanding of Exercise Prescription;Increase Physical Activity;Increase Strength and Stamina Understanding of Exercise Prescription;Increase Physical Activity;Increase Strength and Stamina Understanding of Exercise Prescription;Increase Physical Activity;Increase Strength and Stamina   Comments Reviewed RPE  and dyspnea scale, THR and program prescription with pt today.  Pt voiced understanding and was given a copy of goals to take home. Sandra has increased a few levels. Biostep up to level 3 from level  2. He is using 4 lb hand weights. Will continue to encourage and monitor exercise  progression. Reilley is doing well in rehab. He recently increased his laps walked on the track up to 30 laps. He also improved to level 6 on the T4 nustep. He may benefit from adding more seated machines for diversity. We will continue to monitor his progress in the program. El is doing well in rehab. He recently increased his hand weights from 4 to 5 lbs. He also increased his track laps to 35 in 15 minutes, and we implemented the new T6 nustep into his exercise prescription. We will continue to monitor his progress in the program. Kym is doing well in rehab. He recently increased his T4 nustep level from 4 to 5. He also increased his track  laps from 35 in 15 minutes to 45. We will continue to monitor his progress in the program.   Expected Outcomes Short: Use RPE daily to regulate intensity. Long: Follow program prescription in THR. [id number] workloads as tolerated with goal of increased stamina and strength.   LTG: Continued exercise progression as tolerated during program and after discharge. Short: Try other seated machines in addition to T4 nustep. Long: Continue exercise to improve strength and stamina. Short: Continue to use the T6 nustep. Long: Continue exercise to improve strength and stamina. Short: Continue to follow current exercise prescription, and progressively increase workloads. Long: Continue exercise to improve strength and stamina.    Row Name 09/12/23 1529 09/26/23 1005           Exercise Goal Re-Evaluation   Exercise Goals Review Understanding of Exercise Prescription;Increase Physical Activity;Increase Strength and Stamina Understanding of Exercise Prescription;Increase Physical Activity;Increase Strength and Stamina      Comments Aniello continues to do well in rehab. He increased  to 60 laps while walking the track for 30 laps. He also increased his overall average MET level to 3.83 METs. He is due for his post and will look to improve on it. We will continue to monitor his progress in the program. Exercise has been improving and his showed over 300 steps imporvement as well.      Expected Outcomes Short: Improve on post . Long: Continue exercise to improve strength and stamina. Short: Improve on strength and stamina. long: Continue exercise, finish Rehab strong and get 5K steps when walking at home               Discharge Exercise Prescription (Final Exercise Prescription Changes):  Exercise Prescription Changes - 09/12/23 1500       Response to Exercise   Blood Pressure (Admit) 132/72    Blood Pressure (Exit) 122/56    Heart Rate (Admit) 63 bpm    Heart Rate (Exercise) 125 bpm     Heart Rate (Exit) 102 bpm    Rating of Perceived Exertion (Exercise) 13    Symptoms none    Duration Progress to 30 minutes of  aerobic without signs/symptoms of physical distress    Intensity THRR unchanged      Progression   Progression Continue to progress workloads to maintain intensity without signs/symptoms of physical distress.    Average METs 3.83      Resistance Training   Training Prescription Yes    Weight 5 lb    Reps 10-15      Interval Training   Interval Training No      NuStep   Level 5    Minutes 15    METs 4.9      Track   Laps 60    Minutes 30    METs 4.27      Oxygen   Maintain Oxygen Saturation 88% or higher             Nutrition:  Target Goals: Understanding of nutrition guidelines, daily intake of sodium 1500mg , cholesterol 200mg , calories 30% from fat and 7% or less from saturated fats, daily to have 5 or more servings of fruits and vegetables.  Education: All About Nutrition: -Group instruction provided by verbal, written material, interactive activities, discussions, models, and posters to present general guidelines for heart healthy nutrition including fat, fiber, MyPlate, the role of sodium in heart healthy nutrition, utilization of the nutrition label, and utilization of this knowledge for meal planning. Follow up email sent as well. Written material given at graduation. Flowsheet Row Cardiac Rehab from 09/07/2023 in [facility] Cardiac and Pulmonary Rehab  Date 08/31/23  Educator JG  Instruction Review Code 1- Verbalizes Understanding       Biometrics:  Pre Biometrics - 06/20/23 1507       Pre Biometrics   Height 5\' 11"  (1.803 m)    Weight 260 lb (117.9 kg)    Waist Circumference 47.5 inches    Hip Circumference 45.5 inches    Waist to Hip Ratio 1.04 %    BMI (Calculated) 36.28    Single Leg Stand 3.35 seconds             Post Biometrics - 09/21/23 1028        Post  Biometrics   Height 6' (1.829 m)    Weight 250 lb 12.8  oz (113.8 kg)    Waist Circumference 46.5 inches    Hip Circumference 43.2 inches    Waist to Hip Ratio  1.08 %    BMI (Calculated) 34.01    Single Leg Stand 4.98 seconds             Nutrition Therapy Plan and Nutrition Goals:  Nutrition Therapy & Goals - 07/13/23 1143       Nutrition Therapy   Diet Cardiac, low Na, carb controlled diet    Protein (specify units) 90    Fiber 30 grams    Whole Grain Foods 3 servings    Saturated Fats 15 max. grams    Fruits and Vegetables 5 servings/day    Sodium 2 grams      Personal Nutrition Goals   Nutrition Goal Watch carb sources/portions at meals    Personal Goal #2 reduce sodium content    Comments Patient drinking more water now, encouraged him to continue to do so. He cut out sweet tea ~5weeks ago. Commended him on this change, encouraged him to watch is juice intake as it similar to sweet tea/soda. He says he mostly has it when his sugar drops low. He has a monitor on his arm and reports it has been dropping low throughout the day as of late. Talked to him about eating complex carbs and pairing them with protein/fat. Provided some examples like apple and peanut butter. Reviewed mediterranean diet handout, encouraged smaller portions, reducing sodium/sat fat content, eating less fried processed foods.      Intervention Plan   Intervention Prescribe, educate and counsel regarding individualized specific dietary modifications aiming towards targeted core components such as weight, hypertension, lipid management, diabetes, heart failure and other comorbidities.;Nutrition handout(s) given to patient.    Expected Outcomes Short Term Goal: Understand basic principles of dietary content, such as calories, fat, sodium, cholesterol and nutrients.;Short Term Goal: A plan has been developed with personal nutrition goals set during dietitian appointment.;Long Term Goal: Adherence to prescribed nutrition plan.             Nutrition  Assessments:  MEDIFICTS Score Key: >=70 Need to make dietary changes  40-70 Heart Healthy Diet <= 40 Therapeutic Level Cholesterol Diet  Flowsheet Row Cardiac Rehab from 06/20/2023 in [facility] Cardiac and Pulmonary Rehab  Picture Your Plate Total Score on Admission 46      Picture Your Plate Scores: <16 Unhealthy dietary pattern with much room for improvement. 41-50 Dietary pattern unlikely to meet recommendations for good health and room for improvement. 51-60 More healthful dietary pattern, with some room for improvement.  >60 Healthy dietary pattern, although there may be some specific behaviors that could be improved.    Nutrition Goals Re-Evaluation:  Nutrition Goals Re-Evaluation     Row Name 09/26/23 1009             Goals   Comment Reports that he still is working on carb portion control. Talked about adding more veggies to meals, choosing leaner meats. His household is very stuck in their eating habits, but they are trying to make the changes recommended as able       Expected Outcome Short: Add more veggies to meals Long: Do better with controlling carb choices                Nutrition Goals Discharge (Final Nutrition Goals Re-Evaluation):  Nutrition Goals Re-Evaluation - 09/26/23 1009       Goals   Comment Reports that he still is working on carb portion control. Talked about adding more veggies to meals, choosing leaner meats. His household is very stuck in their eating habits, but  they are trying to make the changes recommended as able    Expected Outcome Short: Add more veggies to meals Long: Do better with controlling carb choices             Psychosocial: Target Goals: Acknowledge presence or absence of significant depression and/or stress, maximize coping skills, provide positive support system. Participant is able to verbalize types and ability to use techniques and skills needed for reducing stress and depression.   Education: Stress, Anxiety, and  Depression - Group verbal and visual presentation to define topics covered.  Reviews how body is impacted by stress, anxiety, and depression.  Also discusses healthy ways to reduce stress and to treat/manage anxiety and depression.  Written material given at graduation. Flowsheet Row Cardiac Rehab from 09/07/2023 in [facility] Cardiac and Pul[facility] Rehab  Date 07/27/23  Educator SB  Instruction Review Code 1- [employer] Understanding       Education: Sleep Hygiene -Provides group verbal and written instruction about how sleep can affect your health.  Define sleep hygiene, discuss sleep cycles and impact of sleep habits. Review good sleep hygiene tips.    Initial Review & Psychosocial Screening:  Initial Psych Review & Screening - 06/07/23 1023       Initial Review   Current issues with Current Stress Concerns    Source of Stress Concerns Financial;Family      Family Dynamics   Good Support System? Yes   wife, daughter lives behind him     Barriers   Psychosocial barriers to participate in program There are no identifiable barriers or psychosocial needs.      Screening Interventions   Interventions To provide support and resources with identified psychosocial needs;Provide feedback about the scores to participant    Expected Outcomes Short Term goal: Utilizing psychosocial counselor, staff and physician to assist with identification of specific Stressors or current issues interfering with healing process. Setting desired goal for each stressor or current issue identified.;Long Term Goal: Stressors or current issues are controlled or eliminated.;Short Term goal: Identification and review with participant of any Quality of Life or Depression concerns found by scoring the questionnaire.;Long Term goal: The participant improves quality of Life and PHQ9 Scores as seen by post scores and/or verbalization of changes             Quality of Life Scores:   Quality of Life - 06/20/23 1523        Quality of Life   Select Quality of Life      Quality of Life Scores   Health/Function Pre 18.27 %    Socioeconomic Pre 18.86 %    Psych/Spiritual Pre 23.36 %    Family Pre 20.4 %    GLOBAL Pre 19.75 %            Scores of 19 and below usually indicate a poorer quality of life in these areas.  A difference of  2-3 points is a clinically meaningful difference.  A difference of 2-3 points in the total score of the Quality of Life Index has been associated with significant improvement in overall quality of life, self-image, physical symptoms, and general health in studies assessing change in quality of life.  PHQ-9: Review Flowsheet       06/20/2023  Depression screen PHQ 2/9  Decreased Interest 0  Down, Depressed, Hopeless 0  PHQ - 2 Score 0  Altered sleeping 0  Tired, decreased energy 3  Change in appetite 0  Feeling bad or failure about yourself  0  Trouble concentrating 0  Moving slowly or fidgety/restless 0  Suicidal thoughts 0  PHQ-9 Score 3  Difficult doing work/chores Not difficult at all    Details           Interpretation of Total Score  Total Score Depression Severity:  1-4 = Minimal depression, 5-9 = Mild depression, 10-14 = Moderate depression, 15-19 = Moderately severe depression, 20-27 = Severe depression   Psychosocial Evaluation and Intervention:  Psychosocial Evaluation - 06/07/23 1053       Psychosocial Evaluation & Interventions   Interventions Encouraged to exercise with the program and follow exercise prescription    Comments Able has no barriers to attending the program. He wants to increase his stamina and be as healthy as he can. He does have concern with financial and family matters.  His adult son recently had an MI. Oseph will be attending the same CR sessions as his son.  He is ready to start the program.    Expected Outcomes STG Gregori attends all scheduled sessions and is able to decrease his conerns about his son's health. He can  find resources to help him with his financial concerns. lTG Hoyt is able to cintinue with his exercise progression with his son.    Continue Psychosocial Services  Follow up required by staff             Psychosocial Re-Evaluation:  Psychosocial Re-Evaluation     Row Name 07/13/23 1001 09/26/23 1008           Psychosocial Re-Evaluation   Current issues with -- None Identified      Comments Patient reports no issues with their current mental states, sleep, stress, depression or anxiety. Will follow up with patient in a few weeks for any changes. He reports that he isnt having any stress or anxiety or depression. Has good family support with wife helping him. Finds joy with his family and they help him stay positive on his outlook on life      Expected Outcomes Short: Continue to exercise regularly to support mental health and notify staff of any changes. Long: maintain mental health and well being through teaching of rehab or prescribed medications independently. Short: Contineu to attend rehab, exercise and use staff as resource if any changes happen. long: Maintain mental health and well being      Interventions Encouraged to attend Cardiac Rehabilitation for the exercise Encouraged to attend Cardiac Rehabilitation for the exercise      Continue Psychosocial Services  Follow up required by staff Follow up required by staff               Psychosocial Discharge (Final Psychosocial Re-Evaluation):  Psychosocial Re-Evaluation - 09/26/23 1008       Psychosocial Re-Evaluation   Current issues with None Identified    Comments He reports that he isnt having any stress or anxiety or depression. Has good family support with wife helping him. Finds joy with his family and they help him stay positive on his outlook on life    Expected Outcomes Short: Contineu to attend rehab, exercise and use staff as resource if any changes happen. long: Maintain mental health and well being     Interventions Encouraged to attend Cardiac Rehabilitation for the exercise    Continue Psychosocial Services  Follow up required by staff             Vocational Rehabilitation: Provide vocational rehab assistance to qualifying candidates.   Vocational Rehab Evaluation &  Intervention:   Education: Education Goals: Education classes will be provided on a variety of topics geared toward better understanding of heart health and risk factor modification. Participant will state understanding/return demonstration of topics presented as noted by education test scores.  Learning Barriers/Preferences:   General Cardiac Education Topics:  AED/CPR: - Group verbal and written instruction with the use of models to demonstrate the basic use of the AED with the basic ABC's of resuscitation.   Anatomy and Cardiac Procedures: - Group verbal and visual presentation and models provide information about basic cardiac anatomy and function. Reviews the testing methods done to diagnose heart disease and the outcomes of the test results. Describes the treatment choices: Medical Management, Angioplasty, or Coronary Bypass Surgery for treating various heart conditions including Myocardial Infarction, Angina, Valve Disease, and Cardiac Arrhythmias.  Written material given at graduation. Flowsheet Row Cardiac Rehab from 09/07/2023 in [facility] Cardiac and Pulmonary Rehab  Education need identified 06/20/23  Date 09/07/23  Educator SB  Instruction Review Code 1- Verbalizes Understanding       Medication Safety: - Group verbal and visual instruction to review commonly prescribed medications for heart and lung disease. Reviews the medication, class of the drug, and side effects. Includes the steps to properly store meds and maintain the prescription regimen.  Written material given at graduation. Flowsheet Row Cardiac Rehab from 09/07/2023 in [facility] Cardiac and Pulmonary Rehab  Date 07/06/23  Educator SB   Instruction Review Code 1- Verbalizes Understanding       Intimacy: - Group verbal instruction through game format to discuss how heart and lung disease can affect sexual intimacy. Written material given at graduation..   Know Your Numbers and Heart Failure: - Group verbal and visual instruction to discuss disease risk factors for cardiac and pulmonary disease and treatment options.  Reviews associated critical values for Overweight/Obesity, Hypertension, Cholesterol, and Diabetes.  Discusses basics of heart failure: signs/symptoms and treatments.  Introduces Heart Failure Zone chart for action plan for heart failure.  Written material given at graduation.   Infection Prevention: - Provides verbal and written material to individual with discussion of infection control including proper hand washing and proper equipment cleaning during exercise session. Flowsheet Row Cardiac Rehab from 09/07/2023 in [facility] Cardiac and Pulmonary Rehab  Date 06/20/23  Educator NT  Instruction Review Code 1- Verbalizes Understanding       Falls Prevention: - Provides verbal and written material to individual with discussion of falls prevention and safety. Flowsheet Row Cardiac Rehab from 09/07/2023 in [facility] Cardiac and Pulmonary Rehab  Date 06/07/23  Educator SB  Instruction Review Code 1- Verbalizes Understanding       Other: -Provides group and verbal instruction on various topics (see comments) Flowsheet Row Cardiac Rehab from 09/07/2023 in [facility] Cardiac and Pulmonary Rehab  Date 06/22/23  Educator Jeopardy SB  Instruction Review Code 1- Verbalizes Understanding       Knowledge Questionnaire Score:  Knowledge Questionnaire Score - 06/20/23 1524       Knowledge Questionnaire Score   Pre Score 21/26             Core Components/Risk Factors/Patient Goals at Admission:  Personal Goals and Risk Factors at Admission - 06/07/23 1025       Core Components/Risk Factors/Patient Goals on  Admission   Intervention Weight Management: Develop a combined nutrition and exercise program designed to reach desired caloric intake, while maintaining appropriate intake of nutrient and fiber, sodium and fats, and appropriate energy expenditure required for  the weight goal.;Weight Management: Provide education and appropriate resources to help participant work on and attain dietary goals.    Admit Weight 260 lb (117.9 kg)    Goal Weight: Short Term 258 lb (117 kg)    Goal Weight: Long Term 220 lb (99.8 kg)    Expected Outcomes Short Term: Continue to assess and modify interventions until short term weight is achieved;Long Term: Adherence to nutrition and physical activity/exercise program aimed toward attainment of established weight goal;Weight Loss: Understanding of general recommendations for a balanced deficit meal plan, which promotes 1-2 lb weight loss per week and includes a negative energy balance of [phone] kcal/d    Diabetes Yes    Intervention Provide education about signs/symptoms and action to take for hypo/hyperglycemia.;Provide education about proper nutrition, including hydration, and aerobic/resistive exercise prescription along with prescribed medications to achieve blood glucose in normal ranges: Fasting glucose 65-99 mg/dL    Expected Outcomes Short Term: Participant verbalizes understanding of the signs/symptoms and immediate care of hyper/hypoglycemia, proper foot care and importance of medication, aerobic/resistive exercise and nutrition plan for blood glucose control.;Long Term: Attainment of HbA1C < 7%.    Hypertension Yes    Intervention Provide education on lifestyle modifcations including regular physical activity/exercise, weight management, moderate sodium restriction and increased consumption of fresh fruit, vegetables, and low fat dairy, alcohol moderation, and smoking cessation.;Monitor prescription use compliance.    Expected Outcomes Short Term: Continued assessment  and intervention until BP is < 140/62mm HG in hypertensive participants. < 130/75mm HG in hypertensive participants with diabetes, heart failure or chronic kidney disease.;Long Term: Maintenance of blood pressure at goal levels.    Lipids Yes    Intervention Provide education and support for participant on nutrition & aerobic/resistive exercise along with prescribed medications to achieve LDL 70mg , HDL >40mg .    Expected Outcomes Short Term: Participant states understanding of desired cholesterol values and is compliant with medications prescribed. Participant is following exercise prescription and nutrition guidelines.;Long Term: Cholesterol controlled with medications as prescribed, with individualized exercise RX and with personalized nutrition plan. Value goals: LDL < 70mg , HDL > 40 mg.             Education:Diabetes - Individual verbal and written instruction to review signs/symptoms of diabetes, desired ranges of glucose level fasting, after meals and with exercise. Acknowledge that pre and post exercise glucose checks will be done for 3 sessions at entry of program.   Core Components/Risk Factors/Patient Goals Review:   Goals and Risk Factor Review     Row Name 07/13/23 0958 09/26/23 1011           Core Components/Risk Factors/Patient Goals Review   Personal Goals Review Weight Management/Obesity;Hypertension;Diabetes Weight Management/Obesity;Diabetes;Hypertension      Review Patient would like to lose some weight. He is in the process of meeting with the dietitian to go over his eating habits. His blood pressure has been doing well in the program and has no questions about that. He has been checking his sugars routinely and they have been within normal rangres. He would like to reach a weight goal of 220 pounds. He continues to check blood sugars at home, and they have been ~150 on average. His blood pressure has been good at rehab, he doesnt check it at home. Spoke about checking  his blood pressure a few times at home. Have spoke with him about reducing portions of meats and starches, while increasing veggie portion      Expected Outcomes Short: lose a  few pounds in the next few weeks. Long: reach weight goal. Short: Continue to practice portion control of fatty meats and carbs, check blood pressure at home. Long: work towards more balnaced eating habits with less fat and sodium.               Core Components/Risk Factors/Patient Goals at Discharge (Final Review):   Goals and Risk Factor Review - 09/26/23 1011       Core Components/Risk Factors/Patient Goals Review   Personal Goals Review Weight Management/Obesity;Diabetes;Hypertension    Review He continues to check blood sugars at home, and they have been ~150 on average. His blood pressure has been good at rehab, he doesnt check it at home. Spoke about checking his blood pressure a few times at home. Have spoke with him about reducing portions of meats and starches, while increasing veggie portion    Expected Outcomes Short: Continue to practice portion control of fatty meats and carbs, check blood pressure at home. Long: work towards more balnaced eating habits with less fat and sodium.             ITP Comments:  ITP Comments     Row Name 06/07/23 1051 06/20/23 1238 06/22/23 1027 07/04/23 1400 07/25/23 1029   ITP Comments Virtual orientation call completed today. he has an appointment on Date: 06/20/2023  for EP eval and gym Orientation.  Documentation of diagnosis can be found in [facility] Date: 06/05/2023 . Completed and gym orientation. Initial ITP created and sent for review to Dr. Bethann Punches, Medical Director. First full day of exercise!  Patient was oriented to gym and equipment including functions, settings, policies, and procedures.  Patient's individual exercise prescription and treatment plan were reviewed.  All starting workloads were established based on the results of the 6 minute walk test done  at initial orientation visit.  The plan for exercise progression was also introduced and progression will be customized based on patient's performance and goals. 30 Day review completed. Medical Director ITP review done, changes made as directed, and signed approval by Medical Director.   new to program Monjoro stopped and insulin resumed    Row Name 08/02/23 1123 08/30/23 0935 09/27/23 1001       ITP Comments 30 Day review completed. Medical Director ITP review done, changes made as directed, and signed approval by Medical Director. 30 Day review completed. Medical Director ITP review done, changes made as directed, and signed approval by Medical Director. 30 Day review completed. Medical Director ITP review done, changes made as directed, and signed approval by Medical Director.              Comments:

## 2023-09-28 ENCOUNTER — Encounter: Payer: Medicare PPO | Admitting: *Deleted

## 2023-09-28 DIAGNOSIS — Z955 Presence of coronary angioplasty implant and graft: Secondary | ICD-10-CM | POA: Diagnosis not present

## 2023-09-28 DIAGNOSIS — Z48812 Encounter for surgical aftercare following surgery on the circulatory system: Secondary | ICD-10-CM | POA: Diagnosis not present

## 2023-09-28 NOTE — Progress Notes (Signed)
Discharge Summary:  Nicholas Acevedo  (DOB: 24-May-1946)  Nicholas Acevedo graduated today from  rehab with 33 sessions completed.  Details of the patient's exercise prescription and what He needs to do in order to continue the prescription and progress were discussed with patient.  Patient was given a copy of prescription and goals.  Patient verbalized understanding. Nicholas Acevedo plans to continue to exercise by indoor walking, 5,000 steps/day, walking through the house.   6 Minute Walk     Row Name 06/20/23 1505 09/21/23 1023       6 Minute Walk   Phase Initial Discharge    Distance 1130 feet 1370 feet    Distance % Change -- 21 %    Distance Feet Change -- 240 ft    Walk Time 6 minutes 6 minutes    # of Rest Breaks 0 0    MPH 2.14 2.6    METS 1.92 2.5    RPE 11 11    Perceived Dyspnea  1 0    VO2 Peak 6.71 8.8    Symptoms No No    Resting HR 71 bpm 75 bpm    Resting BP 126/74 120/60    Resting Oxygen Saturation  97 % 94 %    Exercise Oxygen Saturation  during 6 min walk 95 % 92 %    Max Ex. HR 104 bpm 100 bpm    Max Ex. BP 144/68 162/66    2 Minute Post BP 136/74 --

## 2023-09-28 NOTE — Progress Notes (Signed)
Daily Session Note  Patient Details  Name: Nicholas Acevedo MRN: [id number] Date of Birth: [birth year] Referring Provider:   Flowsheet Row Cardiac Rehab from 06/20/2023 in [facility] Cardiac and Pulmonary Rehab  Referring Provider Dr. Massie Maroon, MD       Encounter Date: 09/28/2023  Check In:  Session Check In - 09/28/23 1006       Check-In   Supervising physician immediately available to respond to emergencies See telemetry face sheet for immediately available ER MD    Location [facility]-Cardiac & Pulmonary Rehab    Staff Present Maxon Conetta BS, , Exercise Physiologist;Kahari Critzer Katrinka Blazing, RN, Silas Flood, BS, Exercise Physiologist    Virtual Visit No    Medication changes reported     No    Fall or balance concerns reported    No    Warm-up and Cool-down Performed on first and last piece of equipment    Resistance Training Performed Yes    VAD Patient? No    PAD/SET Patient? No      Pain Assessment   Currently in Pain? No/denies                Social History   Tobacco Use  Smoking Status Never  Smokeless Tobacco Never    Goals Met:  Independence with exercise equipment Exercise tolerated well No report of concerns or symptoms today Strength training completed today  Goals Unmet:  Not Applicable  Comments:  Nicholas Acevedo graduated today from  rehab with 33 sessions completed.  Details of the patient's exercise prescription and what He needs to do in order to continue the prescription and progress were discussed with patient.  Patient was given a copy of prescription and goals.  Patient verbalized understanding. Nicholas Acevedo plans to continue to exercise by indoor walking, 5,000 steps/day, walking through the house.    Dr. Bethann Punches is Medical Director for [facility] Cardiac Rehabilitation.  Dr. Vida Rigger is Medical Director for [facility] Pulmonary Rehabilitation.

## 2023-09-28 NOTE — Progress Notes (Signed)
Cardiac Individual Treatment Plan  Patient Details  Name: Nicholas Acevedo MRN: [id number] Date of Birth: May 12, 1946 Referring Provider:   Flowsheet Row Cardiac Rehab from 06/20/2023 in [facility] Cardiac and Pulmonary Rehab  Referring Provider Dr. Massie Maroon, MD       Initial Encounter Date:  Flowsheet Row Cardiac Rehab from 06/20/2023 in [facility] Cardiac and Pulmonary Rehab  Date 06/20/23       Visit Diagnosis: Status post coronary artery stent placement  Patient's Home Medications on Admission:  Current Outpatient Medications:    ascorbic acid (VITAMIN C) 500 MG tablet, Take by mouth. (Patient not taking: Reported on 06/07/2023), Disp: , Rfl:    aspirin EC 81 MG tablet, Take 81 mg by mouth daily., Disp: , Rfl:    atorvastatin (LIPITOR) 40 MG tablet, Take 40 mg by mouth daily., Disp: , Rfl:    brimonidine (ALPHAGAN) 0.2 % ophthalmic solution, Place 1 drop into both eyes 3 (three) times daily., Disp: , Rfl:    Cholecalciferol 50 MCG (2000 UT) CAPS, Take by mouth. (Patient not taking: Reported on 06/07/2023), Disp: , Rfl:    clopidogrel (PLAVIX) 75 MG tablet, Take 75 mg by mouth daily., Disp: , Rfl:    cyanocobalamin 1000 MCG tablet, Take by mouth. (Patient not taking: Reported on 06/07/2023), Disp: , Rfl:    dorzolamide-timolol (COSOPT) 22.3-6.8 MG/ML ophthalmic solution, , Disp: , Rfl:    Dulaglutide 0.75 MG/0.5ML SOPN, Inject into the skin. (Patient not taking: Reported on 06/07/2023), Disp: , Rfl:    empagliflozin (JARDIANCE) 25 MG TABS tablet, Take 25 mg by mouth daily. 25 mg tab take half a tab (12.5mg )  daily, Disp: , Rfl:    glipiZIDE (GLUCOTROL) 5 MG tablet, TAKE 1 TABLET BY MOUTH TWICE DAILY BEFORE MEAL(S), Disp: , Rfl:    hydrocortisone 2.5 % cream, Use as directed. Instructions in handout, Disp: 30 g, Rfl: 3   LANTUS SOLOSTAR 100 UNIT/ML Solostar Pen, Inject 45 Units into the skin daily. AM, Disp: , Rfl:    latanoprost (XALATAN) 0.005 % ophthalmic solution, 1 drop at bedtime. (Patient  not taking: Reported on 06/07/2023), Disp: , Rfl:    losartan (COZAAR) 50 MG tablet, Take 50 mg by mouth daily., Disp: , Rfl:    metFORMIN (GLUCOPHAGE) 1000 MG tablet, Take 1,000 mg by mouth 2 (two) times daily with a meal., Disp: , Rfl:    MOUNJARO 5 MG/0.5ML Pen, Inject 5 mg into the skin once a week. (Patient not taking: Reported on 07/18/2023), Disp: , Rfl:    Netarsudil-Latanoprost (ROCKLATAN) 0.02-0.005 % SOLN, 1 drop nightly, Disp: , Rfl:    omeprazole (PRILOSEC OTC) 20 MG tablet, Take by mouth., Disp: , Rfl:    omeprazole (PRILOSEC) 40 MG capsule, Take 40 mg by mouth daily. (Patient not taking: Reported on 06/07/2023), Disp: , Rfl:    ONE TOUCH ULTRA TEST test strip, USE ONE STRIP TO CHECK GLUCOSE ONCE DAILY, Disp: , Rfl: 3   sildenafil (REVATIO) 20 MG tablet, 2-5 tabs 1 hour prior to intercourse (Patient not taking: Reported on 06/07/2023), Disp: 15 tablet, Rfl: 0   tamsulosin (FLOMAX) 0.4 MG CAPS capsule, Take 1 capsule (0.4 mg total) by mouth daily., Disp: 90 capsule, Rfl: 3  Past Medical History: Past Medical History:  Diagnosis Date   Actinic keratosis 10/31/2007   R forearm - bx proven    Basal cell carcinoma 03/15/2007   L alar groove    Basal cell carcinoma 08/20/2018   L upper med clavicle  BCC (basal cell carcinoma of skin) 11/23/2021   Left lower cheek above jaw, EDC   Cancer (HCC)    prostate   Diabetes mellitus without complication (HCC)    ED (erectile dysfunction)    Esophagitis    GERD (gastroesophageal reflux disease)    Glaucoma    Hyperlipemia    Hypertension    Impotence of organic origin    Sleep apnea    Urinary obstruction     Tobacco Use: Social History   Tobacco Use  Smoking Status Never  Smokeless Tobacco Never    Labs: Review Flowsheet        No data to display           Exercise Target Goals: Exercise Program Goal: Individual exercise prescription set using results from initial 6 min walk test and THRR while considering   patient's activity barriers and safety.   Exercise Prescription Goal: Initial exercise prescription builds to 30-45 minutes a day of aerobic activity, 2-3 days per week.  Home exercise guidelines will be given to patient during program as part of exercise prescription that the participant will acknowledge.   Education: Aerobic Exercise: - Group verbal and visual presentation on the components of exercise prescription. Introduces F.I.T.T principle from ACSM for exercise prescriptions.  Reviews F.I.T.T. principles of aerobic exercise including progression. Written material given at graduation. Flowsheet Row Cardiac Rehab from 09/07/2023 in [facility] Cardiac and Pulmonary Rehab  Education need identified 06/20/23       Education: Resistance Exercise: - Group verbal and visual presentation on the components of exercise prescription. Introduces F.I.T.T principle from ACSM for exercise prescriptions  Reviews F.I.T.T. principles of resistance exercise including progression. Written material given at graduation.    Education: Exercise & Equipment Safety: - Individual verbal instruction and demonstration of equipment use and safety with use of the equipment. Flowsheet Row Cardiac Rehab from 09/07/2023 in [facility] Cardiac and Pulmonary Rehab  Date 06/20/23  Educator NT  Instruction Review Code 1- Verbalizes Understanding       Education: Exercise Physiology & General Exercise Guidelines: - Group verbal and written instruction with models to review the exercise physiology of the cardiovascular system and associated critical values. Provides general exercise guidelines with specific guidelines to those with heart or lung disease.    Education: Flexibility, Balance, Mind/Body Relaxation: - Group verbal and visual presentation with interactive activity on the components of exercise prescription. Introduces F.I.T.T principle from ACSM for exercise prescriptions. Reviews F.I.T.T. principles of flexibility and  balance exercise training including progression. Also discusses the mind body connection.  Reviews various relaxation techniques to help reduce and manage stress (i.e. Deep breathing, progressive muscle relaxation, and visualization). Balance handout provided to take home. Written material given at graduation. Flowsheet Row Cardiac Rehab from 09/07/2023 in [facility] Cardiac and Pulmonary Rehab  Date 08/17/23  Educator NT  Instruction Review Code 1- Verbalizes Understanding       Activity Barriers & Risk Stratification:  Activity Barriers & Cardiac Risk Stratification - 06/20/23 1506       Activity Barriers & Cardiac Risk Stratification   Activity Barriers Other (comment)    Comments Glaucoma impedes vision    Cardiac Risk Stratification Moderate             6 Minute Walk:  6 Minute Walk     Row Name 06/20/23 1505 09/21/23 1023       6 Minute Walk   Phase Initial Discharge    Distance 1130 feet 1370 feet  Distance % Change -- 21 %    Distance Feet Change -- 240 ft    Walk Time 6 minutes 6 minutes    # of Rest Breaks 0 0    MPH 2.14 2.6    METS 1.92 2.5    RPE 11 11    Perceived Dyspnea  1 0    VO2 Peak 6.71 8.8    Symptoms No No    Resting HR 71 bpm 75 bpm    Resting BP 126/74 120/60    Resting Oxygen Saturation  97 % 94 %    Exercise Oxygen Saturation  during 6 min walk 95 % 92 %    Max Ex. HR 104 bpm 100 bpm    Max Ex. BP 144/68 162/66    2 Minute Post BP 136/74 --             Oxygen Initial Assessment:   Oxygen Re-Evaluation:   Oxygen Discharge (Final Oxygen Re-Evaluation):   Initial Exercise Prescription:  Initial Exercise Prescription - 06/20/23 1500       Date of Initial Exercise RX and Referring Provider   Date 06/20/23    Referring Provider Dr. Massie Maroon, MD      Oxygen   Maintain Oxygen Saturation 88% or higher      Recumbant Bike   Level 1    RPM 50    Watts 15    Minutes 15    METs 1.92      NuStep   Level 2    SPM 80     Minutes 15    METs 1.92      Biostep-RELP   Level 1    SPM 50    Minutes 15    METs 1.92      Prescription Details   Frequency (times per week) 2    Duration Progress to 30 minutes of continuous aerobic without signs/symptoms of physical distress      Intensity   THRR 40-80% of Max Heartrate 99-128    Ratings of Perceived Exertion 11-13    Perceived Dyspnea 0-4      Progression   Progression Continue to progress workloads to maintain intensity without signs/symptoms of physical distress.      Resistance Training   Training Prescription Yes    Weight 4 lb    Reps 10-15             Perform Capillary Blood Glucose checks as needed.  Exercise Prescription Changes:   Exercise Prescription Changes     Row Name 06/20/23 1500 06/22/23 1000 07/19/23 0700 08/02/23 1500 08/15/23 1300     Response to Exercise   Blood Pressure (Admit) 126/74 126/74 122/78 142/72 122/60   Blood Pressure (Exercise) 144/68 144/68 168/80 148/74 142/64   Blood Pressure (Exit) 136/74 136/74 140/80 124/62 116/60   Heart Rate (Admit) 71 bpm 71 bpm 64 bpm 66 bpm 60 bpm   Heart Rate (Exercise) 104 bpm 104 bpm 118 bpm 119 bpm 127 bpm   Heart Rate (Exit) 87 bpm 87 bpm 97 bpm 102 bpm 101 bpm   Oxygen Saturation (Admit) 97 % 97 % -- -- --   Oxygen Saturation (Exercise) 95 % 95 % -- -- --   Rating of Perceived Exertion (Exercise) 11 11 13 14 13    Perceived Dyspnea (Exercise) 1 1 -- -- --   Symptoms None None -- none none   Comments Results Results -- -- --   Duration -- -- --  Progress to 30 minutes of  aerobic without signs/symptoms of physical distress Progress to 30 minutes of  aerobic without signs/symptoms of physical distress   Intensity -- -- -- THRR unchanged THRR unchanged     Progression   Progression -- -- -- Continue to progress workloads to maintain intensity without signs/symptoms of physical distress. Continue to progress workloads to maintain intensity without signs/symptoms of  physical distress.   Average METs -- -- 3.23 3.44 3.04     Resistance Training   Training Prescription -- Yes Yes Yes Yes   Weight -- 4 lb 4 lb 4 lb 5   Reps -- 10-15 10-15 10-15 10-15     Interval Training   Interval Training -- -- -- No No     Recumbant Bike   Level -- 1 1 -- --   RPM -- 50 -- -- --   Watts -- 15 15 -- --   Minutes -- 15 15 -- --   METs -- 1.92 2.4 -- --     NuStep   Level -- 2 3 6 4    SPM -- 80 -- -- --   Minutes -- 15 15 15 15    METs -- 1.92 3.6 3.9 4.3     T5 Nustep   Level -- -- -- -- 1  T6   Minutes -- -- -- -- 15   METs -- -- -- -- 2.9     Biostep-RELP   Level -- 1 3 -- --   SPM -- 50 -- -- --   Minutes -- 15 15 -- --   METs -- 1.92 3 -- --     Track   Laps -- -- -- 30 35   Minutes -- -- -- 15 15   METs -- -- -- 2.63 2.9     Oxygen   Maintain Oxygen Saturation -- -- 88% or higher 88% or higher 88% or higher    Row Name 08/29/23 1500 09/12/23 1500           Response to Exercise   Blood Pressure (Admit) 138/66 132/72      Blood Pressure (Exit) 122/64 122/56      Heart Rate (Admit) 67 bpm 63 bpm      Heart Rate (Exercise) 123 bpm 125 bpm      Heart Rate (Exit) 100 bpm 102 bpm      Rating of Perceived Exertion (Exercise) 14 13      Symptoms none none      Duration Progress to 30 minutes of  aerobic without signs/symptoms of physical distress Progress to 30 minutes of  aerobic without signs/symptoms of physical distress      Intensity THRR unchanged THRR unchanged        Progression   Progression Continue to progress workloads to maintain intensity without signs/symptoms of physical distress. Continue to progress workloads to maintain intensity without signs/symptoms of physical distress.      Average METs 3.44 3.83        Resistance Training   Training Prescription Yes Yes      Weight 5 5 lb      Reps 10-15 10-15        Interval Training   Interval Training No No        NuStep   Level 5 5      Minutes 15 15      METs 3.8  4.9        Track   Laps 45 60  Minutes 15 30      METs 3.45 4.27        Oxygen   Maintain Oxygen Saturation 88% or higher 88% or higher               Exercise Comments:   Exercise Comments     Row Name 06/22/23 1028           Exercise Comments First full day of exercise!  Patient was oriented to gym and equipment including functions, settings, policies, and procedures.  Patient's individual exercise prescription and treatment plan were reviewed.  All starting workloads were established based on the results of the 6 minute walk test done at initial orientation visit.  The plan for exercise progression was also introduced and progression will be customized based on patient's performance and goals.                Exercise Goals and Review:   Exercise Goals     Row Name 06/20/23 1239             Exercise Goals   Increase Physical Activity Yes       Intervention Develop an individualized exercise prescription for aerobic and resistive training based on initial evaluation findings, risk stratification, comorbidities and participant's personal goals.;Provide advice, education, support and counseling about physical activity/exercise needs.       Expected Outcomes Short Term: Attend rehab on a regular basis to increase amount of physical activity.;Long Term: Exercising regularly at least 3-5 days a week.;Long Term: Add in home exercise to make exercise part of routine and to increase amount of physical activity.       Increase Strength and Stamina Yes       Intervention Provide advice, education, support and counseling about physical activity/exercise needs.;Develop an individualized exercise prescription for aerobic and resistive training based on initial evaluation findings, risk stratification, comorbidities and participant's personal goals.       Expected Outcomes Short Term: Increase workloads from initial exercise prescription for resistance, speed, and METs.;Short  Term: Perform resistance training exercises routinely during rehab and add in resistance training at home;Long Term: Improve cardiorespiratory fitness, muscular endurance and strength as measured by increased METs and functional capacity ( )       Able to understand and use rate of perceived exertion (RPE) scale Yes       Intervention Provide education and explanation on how to use RPE scale       Expected Outcomes Short Term: Able to use RPE daily in rehab to express subjective intensity level;Long Term:  Able to use RPE to guide intensity level when exercising independently       Able to understand and use Dyspnea scale Yes       Intervention Provide education and explanation on how to use Dyspnea scale       Expected Outcomes Short Term: Able to use Dyspnea scale daily in rehab to express subjective sense of shortness of breath during exertion;Long Term: Able to use Dyspnea scale to guide intensity level when exercising independently       Knowledge and understanding of Target Heart Rate Range (THRR) Yes       Intervention Provide education and explanation of THRR including how the numbers were predicted and where they are located for reference       Expected Outcomes Short Term: Able to state/look up THRR;Long Term: Able to use THRR to govern intensity when exercising independently;Short Term: Able to use daily as guideline for intensity  in rehab       Able to check pulse independently Yes       Intervention Provide education and demonstration on how to check pulse in carotid and radial arteries.;Review the importance of being able to check your own pulse for safety during independent exercise       Expected Outcomes Short Term: Able to explain why pulse checking is important during independent exercise;Long Term: Able to check pulse independently and accurately       Understanding of Exercise Prescription Yes       Intervention Provide education, explanation, and written materials on patient's  individual exercise prescription       Expected Outcomes Short Term: Able to explain program exercise prescription;Long Term: Able to explain home exercise prescription to exercise independently                Exercise Goals Re-Evaluation :  Exercise Goals Re-Evaluation     Row Name 06/22/23 1028 07/19/23 0800 08/02/23 1522 08/15/23 1346 08/29/23 1528     Exercise Goal Re-Evaluation   Exercise Goals Review Able to understand and use rate of perceived exertion (RPE) scale;Able to understand and use Dyspnea scale;Knowledge and understanding of Target Heart Rate Range (THRR);Understanding of Exercise Prescription Understanding of Exercise Prescription;Increase Physical Activity Understanding of Exercise Prescription;Increase Physical Activity;Increase Strength and Stamina Understanding of Exercise Prescription;Increase Physical Activity;Increase Strength and Stamina Understanding of Exercise Prescription;Increase Physical Activity;Increase Strength and Stamina   Comments Reviewed RPE  and dyspnea scale, THR and program prescription with pt today.  Pt voiced understanding and was given a copy of goals to take home. Jiovanny has increased a few levels. Biostep up to level 3 from level  2. He is using 4 lb hand weights. Will continue to encourage and monitor exercise  progression. Rishan is doing well in rehab. He recently increased his laps walked on the track up to 30 laps. He also improved to level 6 on the T4 nustep. He may benefit from adding more seated machines for diversity. We will continue to monitor his progress in the program. Ac is doing well in rehab. He recently increased his hand weights from 4 to 5 lbs. He also increased his track laps to 35 in 15 minutes, and we implemented the new T6 nustep into his exercise prescription. We will continue to monitor his progress in the program. Lewie is doing well in rehab. He recently increased his T4 nustep level from 4 to 5. He also increased his track  laps from 35 in 15 minutes to 45. We will continue to monitor his progress in the program.   Expected Outcomes Short: Use RPE daily to regulate intensity. Long: Follow program prescription in THR. [id number] workloads as tolerated with goal of increased stamina and strength.   LTG: Continued exercise progression as tolerated during program and after discharge. Short: Try other seated machines in addition to T4 nustep. Long: Continue exercise to improve strength and stamina. Short: Continue to use the T6 nustep. Long: Continue exercise to improve strength and stamina. Short: Continue to follow current exercise prescription, and progressively increase workloads. Long: Continue exercise to improve strength and stamina.    Row Name 09/12/23 1529 09/26/23 1005           Exercise Goal Re-Evaluation   Exercise Goals Review Understanding of Exercise Prescription;Increase Physical Activity;Increase Strength and Stamina Understanding of Exercise Prescription;Increase Physical Activity;Increase Strength and Stamina      Comments Eaden continues to do well in rehab. He increased  to 60 laps while walking the track for 30 laps. He also increased his overall average MET level to 3.83 METs. He is due for his post and will look to improve on it. We will continue to monitor his progress in the program. Exercise has been improving and his showed over 300 steps imporvement as well.      Expected Outcomes Short: Improve on post . Long: Continue exercise to improve strength and stamina. Short: Improve on strength and stamina. long: Continue exercise, finish Rehab strong and get 5K steps when walking at home               Discharge Exercise Prescription (Final Exercise Prescription Changes):  Exercise Prescription Changes - 09/12/23 1500       Response to Exercise   Blood Pressure (Admit) 132/72    Blood Pressure (Exit) 122/56    Heart Rate (Admit) 63 bpm    Heart Rate (Exercise) 125 bpm     Heart Rate (Exit) 102 bpm    Rating of Perceived Exertion (Exercise) 13    Symptoms none    Duration Progress to 30 minutes of  aerobic without signs/symptoms of physical distress    Intensity THRR unchanged      Progression   Progression Continue to progress workloads to maintain intensity without signs/symptoms of physical distress.    Average METs 3.83      Resistance Training   Training Prescription Yes    Weight 5 lb    Reps 10-15      Interval Training   Interval Training No      NuStep   Level 5    Minutes 15    METs 4.9      Track   Laps 60    Minutes 30    METs 4.27      Oxygen   Maintain Oxygen Saturation 88% or higher             Nutrition:  Target Goals: Understanding of nutrition guidelines, daily intake of sodium 1500mg , cholesterol 200mg , calories 30% from fat and 7% or less from saturated fats, daily to have 5 or more servings of fruits and vegetables.  Education: All About Nutrition: -Group instruction provided by verbal, written material, interactive activities, discussions, models, and posters to present general guidelines for heart healthy nutrition including fat, fiber, MyPlate, the role of sodium in heart healthy nutrition, utilization of the nutrition label, and utilization of this knowledge for meal planning. Follow up email sent as well. Written material given at graduation. Flowsheet Row Cardiac Rehab from 09/07/2023 in [facility] Cardiac and Pulmonary Rehab  Date 08/31/23  Educator JG  Instruction Review Code 1- Verbalizes Understanding       Biometrics:  Pre Biometrics - 06/20/23 1507       Pre Biometrics   Height 5\' 11"  (1.803 m)    Weight 260 lb (117.9 kg)    Waist Circumference 47.5 inches    Hip Circumference 45.5 inches    Waist to Hip Ratio 1.04 %    BMI (Calculated) 36.28    Single Leg Stand 3.35 seconds             Post Biometrics - 09/21/23 1028        Post  Biometrics   Height 6' (1.829 m)    Weight 250 lb 12.8  oz (113.8 kg)    Waist Circumference 46.5 inches    Hip Circumference 43.2 inches    Waist to Hip Ratio  1.08 %    BMI (Calculated) 34.01    Single Leg Stand 4.98 seconds             Nutrition Therapy Plan and Nutrition Goals:  Nutrition Therapy & Goals - 07/13/23 1143       Nutrition Therapy   Diet Cardiac, low Na, carb controlled diet    Protein (specify units) 90    Fiber 30 grams    Whole Grain Foods 3 servings    Saturated Fats 15 max. grams    Fruits and Vegetables 5 servings/day    Sodium 2 grams      Personal Nutrition Goals   Nutrition Goal Watch carb sources/portions at meals    Personal Goal #2 reduce sodium content    Comments Patient drinking more water now, encouraged him to continue to do so. He cut out sweet tea ~5weeks ago. Commended him on this change, encouraged him to watch is juice intake as it similar to sweet tea/soda. He says he mostly has it when his sugar drops low. He has a monitor on his arm and reports it has been dropping low throughout the day as of late. Talked to him about eating complex carbs and pairing them with protein/fat. Provided some examples like apple and peanut butter. Reviewed mediterranean diet handout, encouraged smaller portions, reducing sodium/sat fat content, eating less fried processed foods.      Intervention Plan   Intervention Prescribe, educate and counsel regarding individualized specific dietary modifications aiming towards targeted core components such as weight, hypertension, lipid management, diabetes, heart failure and other comorbidities.;Nutrition handout(s) given to patient.    Expected Outcomes Short Term Goal: Understand basic principles of dietary content, such as calories, fat, sodium, cholesterol and nutrients.;Short Term Goal: A plan has been developed with personal nutrition goals set during dietitian appointment.;Long Term Goal: Adherence to prescribed nutrition plan.             Nutrition  Assessments:  MEDIFICTS Score Key: >=70 Need to make dietary changes  40-70 Heart Healthy Diet <= 40 Therapeutic Level Cholesterol Diet  Flowsheet Row Cardiac Rehab from 06/20/2023 in [facility] Cardiac and Pulmonary Rehab  Picture Your Plate Total Score on Admission 46      Picture Your Plate Scores: <16 Unhealthy dietary pattern with much room for improvement. 41-50 Dietary pattern unlikely to meet recommendations for good health and room for improvement. 51-60 More healthful dietary pattern, with some room for improvement.  >60 Healthy dietary pattern, although there may be some specific behaviors that could be improved.    Nutrition Goals Re-Evaluation:  Nutrition Goals Re-Evaluation     Row Name 09/26/23 1009             Goals   Comment Reports that he still is working on carb portion control. Talked about adding more veggies to meals, choosing leaner meats. His household is very stuck in their eating habits, but they are trying to make the changes recommended as able       Expected Outcome Short: Add more veggies to meals Long: Do better with controlling carb choices                Nutrition Goals Discharge (Final Nutrition Goals Re-Evaluation):  Nutrition Goals Re-Evaluation - 09/26/23 1009       Goals   Comment Reports that he still is working on carb portion control. Talked about adding more veggies to meals, choosing leaner meats. His household is very stuck in their eating habits, but  they are trying to make the changes recommended as able    Expected Outcome Short: Add more veggies to meals Long: Do better with controlling carb choices             Psychosocial: Target Goals: Acknowledge presence or absence of significant depression and/or stress, maximize coping skills, provide positive support system. Participant is able to verbalize types and ability to use techniques and skills needed for reducing stress and depression.   Education: Stress, Anxiety, and  Depression - Group verbal and visual presentation to define topics covered.  Reviews how body is impacted by stress, anxiety, and depression.  Also discusses healthy ways to reduce stress and to treat/manage anxiety and depression.  Written material given at graduation. Flowsheet Row Cardiac Rehab from 09/07/2023 in [facility] Cardiac and Pul[facility] Re[facility]  Date 07/27/23  Educator SB  Instruction Review Code 1- [employer] Understanding       Education: Sleep Hygiene -Provides group verbal and written instruction about how sleep can affect your health.  Define sleep hygiene, discuss sleep cycles and impact of sleep habits. Review good sleep hygiene tips.    Initial Review & Psychosocial Screening:  Initial Psych Review & Screening - 06/07/23 1023       Initial Review   Current issues with Current Stress Concerns    Source of Stress Concerns Financial;Family      Family Dynamics   Good Support System? Yes   wife, daughter lives behind him     Barriers   Psychosocial barriers to participate in program There are no identifiable barriers or psychosocial needs.      Screening Interventions   Interventions To provide support and resources with identified psychosocial needs;Provide feedback about the scores to participant    Expected Outcomes Short Term goal: Utilizing psychosocial counselor, staff and physician to assist with identification of specific Stressors or current issues interfering with healing process. Setting desired goal for each stressor or current issue identified.;Long Term Goal: Stressors or current issues are controlled or eliminated.;Short Term goal: Identification and review with participant of any Quality of Life or Depression concerns found by scoring the questionnaire.;Long Term goal: The participant improves quality of Life and PHQ9 Scores as seen by post scores and/or verbalization of changes             Quality of Life Scores:   Quality of Life - 06/20/23 1523        Quality of Life   Select Quality of Life      Quality of Life Scores   Health/Function Pre 18.27 %    Socioeconomic Pre 18.86 %    Psych/Spiritual Pre 23.36 %    Family Pre 20.4 %    GLOBAL Pre 19.75 %            Scores of 19 and below usually indicate a poorer quality of life in these areas.  A difference of  2-3 points is a clinically meaningful difference.  A difference of 2-3 points in the total score of the Quality of Life Index has been associated with significant improvement in overall quality of life, self-image, physical symptoms, and general health in studies assessing change in quality of life.  PHQ-9: Review Flowsheet       06/20/2023  Depression screen PHQ 2/9  Decreased Interest 0  Down, Depressed, Hopeless 0  PHQ - 2 Score 0  Altered sleeping 0  Tired, decreased energy 3  Change in appetite 0  Feeling bad or failure about yourself  0  Trouble concentrating 0  Moving slowly or fidgety/restless 0  Suicidal thoughts 0  PHQ-9 Score 3  Difficult doing work/chores Not difficult at all    Details           Interpretation of Total Score  Total Score Depression Severity:  1-4 = Minimal depression, 5-9 = Mild depression, 10-14 = Moderate depression, 15-19 = Moderately severe depression, 20-27 = Severe depression   Psychosocial Evaluation and Intervention:  Psychosocial Evaluation - 06/07/23 1053       Psychosocial Evaluation & Interventions   Interventions Encouraged to exercise with the program and follow exercise prescription    Comments Altan has no barriers to attending the program. He wants to increase his stamina and be as healthy as he can. He does have concern with financial and family matters.  His adult son recently had an MI. Bernon will be attending the same CR sessions as his son.  He is ready to start the program.    Expected Outcomes STG Finnigan attends all scheduled sessions and is able to decrease his conerns about his son's health. He can  find resources to help him with his financial concerns. lTG Joziah is able to cintinue with his exercise progression with his son.    Continue Psychosocial Services  Follow up required by staff             Psychosocial Re-Evaluation:  Psychosocial Re-Evaluation     Row Name 07/13/23 1001 09/26/23 1008           Psychosocial Re-Evaluation   Current issues with -- None Identified      Comments Patient reports no issues with their current mental states, sleep, stress, depression or anxiety. Will follow up with patient in a few weeks for any changes. He reports that he isnt having any stress or anxiety or depression. Has good family support with wife helping him. Finds joy with his family and they help him stay positive on his outlook on life      Expected Outcomes Short: Continue to exercise regularly to support mental health and notify staff of any changes. Long: maintain mental health and well being through teaching of rehab or prescribed medications independently. Short: Contineu to attend rehab, exercise and use staff as resource if any changes happen. long: Maintain mental health and well being      Interventions Encouraged to attend Cardiac Rehabilitation for the exercise Encouraged to attend Cardiac Rehabilitation for the exercise      Continue Psychosocial Services  Follow up required by staff Follow up required by staff               Psychosocial Discharge (Final Psychosocial Re-Evaluation):  Psychosocial Re-Evaluation - 09/26/23 1008       Psychosocial Re-Evaluation   Current issues with None Identified    Comments He reports that he isnt having any stress or anxiety or depression. Has good family support with wife helping him. Finds joy with his family and they help him stay positive on his outlook on life    Expected Outcomes Short: Contineu to attend rehab, exercise and use staff as resource if any changes happen. long: Maintain mental health and well being     Interventions Encouraged to attend Cardiac Rehabilitation for the exercise    Continue Psychosocial Services  Follow up required by staff             Vocational Rehabilitation: Provide vocational rehab assistance to qualifying candidates.   Vocational Rehab Evaluation &  Intervention:   Education: Education Goals: Education classes will be provided on a variety of topics geared toward better understanding of heart health and risk factor modification. Participant will state understanding/return demonstration of topics presented as noted by education test scores.  Learning Barriers/Preferences:   General Cardiac Education Topics:  AED/CPR: - Group verbal and written instruction with the use of models to demonstrate the basic use of the AED with the basic ABC's of resuscitation.   Anatomy and Cardiac Procedures: - Group verbal and visual presentation and models provide information about basic cardiac anatomy and function. Reviews the testing methods done to diagnose heart disease and the outcomes of the test results. Describes the treatment choices: Medical Management, Angioplasty, or Coronary Bypass Surgery for treating various heart conditions including Myocardial Infarction, Angina, Valve Disease, and Cardiac Arrhythmias.  Written material given at graduation. Flowsheet Row Cardiac Rehab from 09/07/2023 in [facility] Cardiac and Pulmonary Rehab  Education need identified 06/20/23  Date 09/07/23  Educator SB  Instruction Review Code 1- Verbalizes Understanding       Medication Safety: - Group verbal and visual instruction to review commonly prescribed medications for heart and lung disease. Reviews the medication, class of the drug, and side effects. Includes the steps to properly store meds and maintain the prescription regimen.  Written material given at graduation. Flowsheet Row Cardiac Rehab from 09/07/2023 in [facility] Cardiac and Pulmonary Rehab  Date 07/06/23  Educator SB   Instruction Review Code 1- Verbalizes Understanding       Intimacy: - Group verbal instruction through game format to discuss how heart and lung disease can affect sexual intimacy. Written material given at graduation..   Know Your Numbers and Heart Failure: - Group verbal and visual instruction to discuss disease risk factors for cardiac and pulmonary disease and treatment options.  Reviews associated critical values for Overweight/Obesity, Hypertension, Cholesterol, and Diabetes.  Discusses basics of heart failure: signs/symptoms and treatments.  Introduces Heart Failure Zone chart for action plan for heart failure.  Written material given at graduation.   Infection Prevention: - Provides verbal and written material to individual with discussion of infection control including proper hand washing and proper equipment cleaning during exercise session. Flowsheet Row Cardiac Rehab from 09/07/2023 in [facility] Cardiac and Pulmonary Rehab  Date 06/20/23  Educator NT  Instruction Review Code 1- Verbalizes Understanding       Falls Prevention: - Provides verbal and written material to individual with discussion of falls prevention and safety. Flowsheet Row Cardiac Rehab from 09/07/2023 in [facility] Cardiac and Pulmonary Rehab  Date 06/07/23  Educator SB  Instruction Review Code 1- Verbalizes Understanding       Other: -Provides group and verbal instruction on various topics (see comments) Flowsheet Row Cardiac Rehab from 09/07/2023 in [facility] Cardiac and Pulmonary Rehab  Date 06/22/23  Educator Jeopardy SB  Instruction Review Code 1- Verbalizes Understanding       Knowledge Questionnaire Score:  Knowledge Questionnaire Score - 06/20/23 1524       Knowledge Questionnaire Score   Pre Score 21/26             Core Components/Risk Factors/Patient Goals at Admission:  Personal Goals and Risk Factors at Admission - 06/07/23 1025       Core Components/Risk Factors/Patient Goals on  Admission   Intervention Weight Management: Develop a combined nutrition and exercise program designed to reach desired caloric intake, while maintaining appropriate intake of nutrient and fiber, sodium and fats, and appropriate energy expenditure required for  the weight goal.;Weight Management: Provide education and appropriate resources to help participant work on and attain dietary goals.    Admit Weight 260 lb (117.9 kg)    Goal Weight: Short Term 258 lb (117 kg)    Goal Weight: Long Term 220 lb (99.8 kg)    Expected Outcomes Short Term: Continue to assess and modify interventions until short term weight is achieved;Long Term: Adherence to nutrition and physical activity/exercise program aimed toward attainment of established weight goal;Weight Loss: Understanding of general recommendations for a balanced deficit meal plan, which promotes 1-2 lb weight loss per week and includes a negative energy balance of [phone] kcal/d    Diabetes Yes    Intervention Provide education about signs/symptoms and action to take for hypo/hyperglycemia.;Provide education about proper nutrition, including hydration, and aerobic/resistive exercise prescription along with prescribed medications to achieve blood glucose in normal ranges: Fasting glucose 65-99 mg/dL    Expected Outcomes Short Term: Participant verbalizes understanding of the signs/symptoms and immediate care of hyper/hypoglycemia, proper foot care and importance of medication, aerobic/resistive exercise and nutrition plan for blood glucose control.;Long Term: Attainment of HbA1C < 7%.    Hypertension Yes    Intervention Provide education on lifestyle modifcations including regular physical activity/exercise, weight management, moderate sodium restriction and increased consumption of fresh fruit, vegetables, and low fat dairy, alcohol moderation, and smoking cessation.;Monitor prescription use compliance.    Expected Outcomes Short Term: Continued assessment  and intervention until BP is < 140/90mm HG in hypertensive participants. < 130/29mm HG in hypertensive participants with diabetes, heart failure or chronic kidney disease.;Long Term: Maintenance of blood pressure at goal levels.    Lipids Yes    Intervention Provide education and support for participant on nutrition & aerobic/resistive exercise along with prescribed medications to achieve LDL 70mg , HDL >40mg .    Expected Outcomes Short Term: Participant states understanding of desired cholesterol values and is compliant with medications prescribed. Participant is following exercise prescription and nutrition guidelines.;Long Term: Cholesterol controlled with medications as prescribed, with individualized exercise RX and with personalized nutrition plan. Value goals: LDL < 70mg , HDL > 40 mg.             Education:Diabetes - Individual verbal and written instruction to review signs/symptoms of diabetes, desired ranges of glucose level fasting, after meals and with exercise. Acknowledge that pre and post exercise glucose checks will be done for 3 sessions at entry of program.   Core Components/Risk Factors/Patient Goals Review:   Goals and Risk Factor Review     Row Name 07/13/23 0958 09/26/23 1011           Core Components/Risk Factors/Patient Goals Review   Personal Goals Review Weight Management/Obesity;Hypertension;Diabetes Weight Management/Obesity;Diabetes;Hypertension      Review Patient would like to lose some weight. He is in the process of meeting with the dietitian to go over his eating habits. His blood pressure has been doing well in the program and has no questions about that. He has been checking his sugars routinely and they have been within normal rangres. He would like to reach a weight goal of 220 pounds. He continues to check blood sugars at home, and they have been ~150 on average. His blood pressure has been good at rehab, he doesnt check it at home. Spoke about checking  his blood pressure a few times at home. Have spoke with him about reducing portions of meats and starches, while increasing veggie portion      Expected Outcomes Short: lose a  few pounds in the next few weeks. Long: reach weight goal. Short: Continue to practice portion control of fatty meats and carbs, check blood pressure at home. Long: work towards more balnaced eating habits with less fat and sodium.               Core Components/Risk Factors/Patient Goals at Discharge (Final Review):   Goals and Risk Factor Review - 09/26/23 1011       Core Components/Risk Factors/Patient Goals Review   Personal Goals Review Weight Management/Obesity;Diabetes;Hypertension    Review He continues to check blood sugars at home, and they have been ~150 on average. His blood pressure has been good at rehab, he doesnt check it at home. Spoke about checking his blood pressure a few times at home. Have spoke with him about reducing portions of meats and starches, while increasing veggie portion    Expected Outcomes Short: Continue to practice portion control of fatty meats and carbs, check blood pressure at home. Long: work towards more balnaced eating habits with less fat and sodium.             ITP Comments:  ITP Comments     Row Name 06/07/23 1051 06/20/23 1238 06/22/23 1027 07/04/23 1400 07/25/23 1029   ITP Comments Virtual orientation call completed today. he has an appointment on Date: 06/20/2023  for EP eval and gym Orientation.  Documentation of diagnosis can be found in [facility] Date: 06/05/2023 . Completed and gym orientation. Initial ITP created and sent for review to Dr. Bethann Punches, Medical Director. First full day of exercise!  Patient was oriented to gym and equipment including functions, settings, policies, and procedures.  Patient's individual exercise prescription and treatment plan were reviewed.  All starting workloads were established based on the results of the 6 minute walk test done  at initial orientation visit.  The plan for exercise progression was also introduced and progression will be customized based on patient's performance and goals. 30 Day review completed. Medical Director ITP review done, changes made as directed, and signed approval by Medical Director.   new to program Monjoro stopped and insulin resumed    Row Name 08/02/23 1123 08/30/23 0935 09/27/23 1001 09/28/23 1008     ITP Comments 30 Day review completed. Medical Director ITP review done, changes made as directed, and signed approval by Medical Director. 30 Day review completed. Medical Director ITP review done, changes made as directed, and signed approval by Medical Director. 30 Day review completed. Medical Director ITP review done, changes made as directed, and signed approval by Medical Director. Roczen graduated today from  rehab with 33 sessions completed.  Details of the patient's exercise prescription and what He needs to do in order to continue the prescription and progress were discussed with patient.  Patient was given a copy of prescription and goals.  Patient verbalized understanding. Adorian plans to continue to exercise by indoor walking, 5,000 steps/day, walking through the house.             Comments: Discharge ITP

## 2023-09-30 DIAGNOSIS — E1165 Type 2 diabetes mellitus with hyperglycemia: Secondary | ICD-10-CM | POA: Diagnosis not present

## 2023-10-05 DIAGNOSIS — H401133 Primary open-angle glaucoma, bilateral, severe stage: Secondary | ICD-10-CM | POA: Diagnosis not present

## 2023-10-19 DIAGNOSIS — E1142 Type 2 diabetes mellitus with diabetic polyneuropathy: Secondary | ICD-10-CM | POA: Diagnosis not present

## 2023-10-19 DIAGNOSIS — E7800 Pure hypercholesterolemia, unspecified: Secondary | ICD-10-CM | POA: Diagnosis not present

## 2023-10-23 ENCOUNTER — Other Ambulatory Visit: Payer: Self-pay

## 2023-10-23 DIAGNOSIS — N401 Enlarged prostate with lower urinary tract symptoms: Secondary | ICD-10-CM

## 2023-10-24 ENCOUNTER — Other Ambulatory Visit: Payer: Medicare PPO

## 2023-10-24 DIAGNOSIS — N401 Enlarged prostate with lower urinary tract symptoms: Secondary | ICD-10-CM | POA: Diagnosis not present

## 2023-10-25 LAB — PSA: Prostate Specific Ag, Serum: 0.7 ng/mL (ref 0.0–4.0)

## 2023-10-26 ENCOUNTER — Other Ambulatory Visit: Payer: Medicare PPO

## 2023-10-26 DIAGNOSIS — E1142 Type 2 diabetes mellitus with diabetic polyneuropathy: Secondary | ICD-10-CM | POA: Diagnosis not present

## 2023-10-26 DIAGNOSIS — I129 Hypertensive chronic kidney disease with stage 1 through stage 4 chronic kidney disease, or unspecified chronic kidney disease: Secondary | ICD-10-CM | POA: Diagnosis not present

## 2023-10-26 DIAGNOSIS — Z1331 Encounter for screening for depression: Secondary | ICD-10-CM | POA: Diagnosis not present

## 2023-10-26 DIAGNOSIS — K219 Gastro-esophageal reflux disease without esophagitis: Secondary | ICD-10-CM | POA: Diagnosis not present

## 2023-10-26 DIAGNOSIS — N401 Enlarged prostate with lower urinary tract symptoms: Secondary | ICD-10-CM | POA: Diagnosis not present

## 2023-10-26 DIAGNOSIS — E7800 Pure hypercholesterolemia, unspecified: Secondary | ICD-10-CM | POA: Diagnosis not present

## 2023-10-26 DIAGNOSIS — Z0000 Encounter for general adult medical examination without abnormal findings: Secondary | ICD-10-CM | POA: Diagnosis not present

## 2023-10-26 DIAGNOSIS — G4733 Obstructive sleep apnea (adult) (pediatric): Secondary | ICD-10-CM | POA: Diagnosis not present

## 2023-10-27 ENCOUNTER — Ambulatory Visit: Payer: Medicare PPO | Admitting: Urology

## 2023-10-27 ENCOUNTER — Encounter: Payer: Self-pay | Admitting: Urology

## 2023-10-27 VITALS — BP 138/78 | HR 71 | Ht 70.0 in | Wt 230.0 lb

## 2023-10-27 DIAGNOSIS — Z8546 Personal history of malignant neoplasm of prostate: Secondary | ICD-10-CM | POA: Diagnosis not present

## 2023-10-27 DIAGNOSIS — N401 Enlarged prostate with lower urinary tract symptoms: Secondary | ICD-10-CM | POA: Diagnosis not present

## 2023-10-27 MED ORDER — GEMTESA 75 MG PO TABS: 75.0000 mg | ORAL_TABLET | Freq: Every day | ORAL | Status: DC

## 2023-10-27 NOTE — Progress Notes (Signed)
I, Maysun Anabel Bene, acting as a scribe for Riki Altes, MD., have documented all relevant documentation on the behalf of Riki Altes, MD, as directed by Riki Altes, MD while in the presence of Riki Altes, MD.  10/27/2023 4:16 PM   Nicholas Acevedo June 30, 1946 [id number]  Referring provider: Marina Goodell, MD [address] [place],  [place] [address]  Chief Complaint  Patient presents with   Prostate Cancer   Urologic history: 1.  T1c adenocarcinoma prostate Biopsy August 2009 PSA 3.8; 24 cc gland Prostate cryoablation 09/2008   2.  BPH with LUTS Tamsulosin 0.4 mg daily   3.  Erectile dysfunction PDE 5 inhibitor refractory  HPI: Nicholas Acevedo is a 77 y.o. male presents for annual follow-up. He is accompanied by his wife today.   Since his last visit, he underwent coronary stent placement June 2024, coronary stent placement June 2024.  He has noted increased urinary frequency, urgency with occasional episodes of urge incontinence since his last visit. Denies dysuria, gross hematuria Denies flank, abdominal or pelvic pain   PMH: Past Medical History:  Diagnosis Date   Actinic keratosis 10/31/2007   R forearm - bx proven    Basal cell carcinoma 03/15/2007   L alar groove    Basal cell carcinoma 08/20/2018   L upper med clavicle    BCC (basal cell carcinoma of skin) 11/23/2021   Left lower cheek above jaw, EDC   Cancer (HCC)    prostate   Diabetes mellitus without complication (HCC)    ED (erectile dysfunction)    Esophagitis    GERD (gastroesophageal reflux disease)    Glaucoma    Hyperlipemia    Hypertension    Impotence of organic origin    Sleep apnea    Urinary obstruction     Surgical History: Past Surgical History:  Procedure Laterality Date   COLONOSCOPY     COLONOSCOPY WITH PROPOFOL N/A 09/12/2017   Procedure: COLONOSCOPY WITH PROPOFOL;  Surgeon: Christena Deem, MD;  Location: [facility] ENDOSCOPY;  Service: Endoscopy;   Laterality: N/A;   COLONOSCOPY WITH PROPOFOL N/A 07/29/2022   Procedure: COLONOSCOPY WITH PROPOFOL;  Surgeon: Regis Bill, MD;  Location: [facility] ENDOSCOPY;  Service: Endoscopy;  Laterality: N/A;   ESOPHAGOGASTRODUODENOSCOPY (EGD) WITH PROPOFOL N/A 07/29/2022   Procedure: ESOPHAGOGASTRODUODENOSCOPY (EGD) WITH PROPOFOL;  Surgeon: Regis Bill, MD;  Location: [facility] ENDOSCOPY;  Service: Endoscopy;  Laterality: N/A;   EYE SURGERY     PROSTATE CRYOABLATION      Home Medications:  Allergies as of 10/27/2023   No Known Allergies      Medication List        Accurate as of October 27, 2023  4:16 PM. If you have any questions, ask your nurse or doctor.          STOP taking these medications    ascorbic acid 500 MG tablet Commonly known as: VITAMIN C Stopped by: Riki Altes   Cholecalciferol 50 MCG (2000 UT) Caps Stopped by: Riki Altes   sildenafil 20 MG tablet Commonly known as: REVATIO Stopped by: Riki Altes       TAKE these medications    aspirin EC 81 MG tablet Take 81 mg by mouth daily.   atorvastatin 40 MG tablet Commonly known as: LIPITOR Take 40 mg by mouth daily.   brimonidine 0.2 % ophthalmic solution Commonly known as: ALPHAGAN Place 1 drop into both eyes 3 (three) times  daily.   clopidogrel 75 MG tablet Commonly known as: PLAVIX Take 75 mg by mouth daily.   cyanocobalamin 1000 MCG tablet Take by mouth.   dorzolamide-timolol 2-0.5 % ophthalmic solution Commonly known as: COSOPT   Dulaglutide 0.75 MG/0.5ML Soaj Inject into the skin.   empagliflozin 25 MG Tabs tablet Commonly known as: JARDIANCE Take 25 mg by mouth daily. 25 mg tab take half a tab (12.5mg )  daily   Gemtesa 75 MG Tabs Generic drug: Vibegron Take 1 tablet (75 mg total) by mouth daily. Started by: Riki Altes   glipiZIDE 5 MG tablet Commonly known as: GLUCOTROL TAKE 1 TABLET BY MOUTH TWICE DAILY BEFORE MEAL(S)   hydrocortisone 2.5 % cream Use as  directed. Instructions in handout   Lantus SoloStar 100 UNIT/ML Solostar Pen Generic drug: insulin glargine Inject 45 Units into the skin daily. AM   latanoprost 0.005 % ophthalmic solution Commonly known as: XALATAN 1 drop at bedtime.   losartan 50 MG tablet Commonly known as: COZAAR Take 50 mg by mouth daily.   metFORMIN 1000 MG tablet Commonly known as: GLUCOPHAGE Take 1,000 mg by mouth 2 (two) times daily with a meal.   Mounjaro 5 MG/0.5ML Pen Generic drug: tirzepatide Inject 5 mg into the skin once a week.   omeprazole 20 MG tablet Commonly known as: PRILOSEC OTC Take by mouth.   omeprazole 40 MG capsule Commonly known as: PRILOSEC Take 40 mg by mouth daily.   ONE TOUCH ULTRA TEST test strip Generic drug: glucose blood USE ONE STRIP TO CHECK GLUCOSE ONCE DAILY   Rocklatan 0.02-0.005 % Soln Generic drug: Netarsudil-Latanoprost 1 drop nightly   tamsulosin 0.4 MG Caps capsule Commonly known as: FLOMAX Take 1 capsule (0.4 mg total) by mouth daily.        Allergies: No Known Allergies  Social History:  reports that he has never smoked. He has never used smokeless tobacco. He reports that he does not drink alcohol and does not use drugs.   Physical Exam: BP 138/78   Pulse 71   Ht 5\' 10"  (1.778 m)   Wt 230 lb (104.3 kg)   BMI 33.00 kg/m   Constitutional:  Alert, No acute distress. HEENT: [place] AT. Respiratory: Normal respiratory effort, no increased work of breathing. Psychiatric: Normal mood and affect.   Assessment & Plan:    1. BPH with LUTS He has had increased storage related voiding symptoms. He was interested in trial of a beta-3 agonist and given Gemtesa samples to 75 mg daily x1 month. He will call back regarding efficacy Continues tamsulosin   2. History of prostate cancer Stable PSA Continue annual follow-up. He is no longer driving and is considering seeing one of our providers in [place] for an annual visit. He was tentatively scheduled  with an annual visit here, but they will call back if they desire to switch to [place].   I have reviewed the above documentation for accuracy and completeness, and I agree with the above.   Riki Altes, MD  [facility] [address], [address] [place], [place] [address] [phone]

## 2023-10-29 ENCOUNTER — Encounter: Payer: Self-pay | Admitting: Urology

## 2023-10-31 DIAGNOSIS — E1165 Type 2 diabetes mellitus with hyperglycemia: Secondary | ICD-10-CM | POA: Diagnosis not present

## 2023-11-10 DIAGNOSIS — I440 Atrioventricular block, first degree: Secondary | ICD-10-CM | POA: Diagnosis not present

## 2023-11-10 DIAGNOSIS — R9439 Abnormal result of other cardiovascular function study: Secondary | ICD-10-CM | POA: Diagnosis not present

## 2023-11-10 DIAGNOSIS — Z955 Presence of coronary angioplasty implant and graft: Secondary | ICD-10-CM | POA: Diagnosis not present

## 2023-11-10 DIAGNOSIS — I10 Essential (primary) hypertension: Secondary | ICD-10-CM | POA: Diagnosis not present

## 2023-11-10 DIAGNOSIS — I2510 Atherosclerotic heart disease of native coronary artery without angina pectoris: Secondary | ICD-10-CM | POA: Diagnosis not present

## 2023-11-10 DIAGNOSIS — E7849 Other hyperlipidemia: Secondary | ICD-10-CM | POA: Diagnosis not present

## 2023-11-10 DIAGNOSIS — I77810 Thoracic aortic ectasia: Secondary | ICD-10-CM | POA: Diagnosis not present

## 2023-11-27 DIAGNOSIS — E1169 Type 2 diabetes mellitus with other specified complication: Secondary | ICD-10-CM | POA: Diagnosis not present

## 2023-11-27 DIAGNOSIS — I152 Hypertension secondary to endocrine disorders: Secondary | ICD-10-CM | POA: Diagnosis not present

## 2023-11-27 DIAGNOSIS — E1142 Type 2 diabetes mellitus with diabetic polyneuropathy: Secondary | ICD-10-CM | POA: Diagnosis not present

## 2023-11-27 DIAGNOSIS — Z794 Long term (current) use of insulin: Secondary | ICD-10-CM | POA: Diagnosis not present

## 2023-11-27 DIAGNOSIS — E785 Hyperlipidemia, unspecified: Secondary | ICD-10-CM | POA: Diagnosis not present

## 2023-11-27 DIAGNOSIS — E1159 Type 2 diabetes mellitus with other circulatory complications: Secondary | ICD-10-CM | POA: Diagnosis not present

## 2023-11-30 DIAGNOSIS — E1165 Type 2 diabetes mellitus with hyperglycemia: Secondary | ICD-10-CM | POA: Diagnosis not present

## 2023-12-31 DIAGNOSIS — E1165 Type 2 diabetes mellitus with hyperglycemia: Secondary | ICD-10-CM | POA: Diagnosis not present

## 2024-01-09 ENCOUNTER — Other Ambulatory Visit: Payer: Self-pay | Admitting: Urology

## 2024-01-31 DIAGNOSIS — E1165 Type 2 diabetes mellitus with hyperglycemia: Secondary | ICD-10-CM | POA: Diagnosis not present

## 2024-02-09 DIAGNOSIS — H401133 Primary open-angle glaucoma, bilateral, severe stage: Secondary | ICD-10-CM | POA: Diagnosis not present

## 2024-02-28 DIAGNOSIS — E1165 Type 2 diabetes mellitus with hyperglycemia: Secondary | ICD-10-CM | POA: Diagnosis not present

## 2024-03-30 DIAGNOSIS — E1165 Type 2 diabetes mellitus with hyperglycemia: Secondary | ICD-10-CM | POA: Diagnosis not present

## 2024-04-02 DIAGNOSIS — E1169 Type 2 diabetes mellitus with other specified complication: Secondary | ICD-10-CM | POA: Diagnosis not present

## 2024-04-02 DIAGNOSIS — E785 Hyperlipidemia, unspecified: Secondary | ICD-10-CM | POA: Diagnosis not present

## 2024-04-02 DIAGNOSIS — E1159 Type 2 diabetes mellitus with other circulatory complications: Secondary | ICD-10-CM | POA: Diagnosis not present

## 2024-04-02 DIAGNOSIS — E1142 Type 2 diabetes mellitus with diabetic polyneuropathy: Secondary | ICD-10-CM | POA: Diagnosis not present

## 2024-04-02 DIAGNOSIS — Z794 Long term (current) use of insulin: Secondary | ICD-10-CM | POA: Diagnosis not present

## 2024-04-02 DIAGNOSIS — I152 Hypertension secondary to endocrine disorders: Secondary | ICD-10-CM | POA: Diagnosis not present

## 2024-04-17 DIAGNOSIS — E1142 Type 2 diabetes mellitus with diabetic polyneuropathy: Secondary | ICD-10-CM | POA: Diagnosis not present

## 2024-04-17 DIAGNOSIS — B351 Tinea unguium: Secondary | ICD-10-CM | POA: Diagnosis not present

## 2024-04-17 DIAGNOSIS — Z794 Long term (current) use of insulin: Secondary | ICD-10-CM | POA: Diagnosis not present

## 2024-04-25 DIAGNOSIS — E7800 Pure hypercholesterolemia, unspecified: Secondary | ICD-10-CM | POA: Diagnosis not present

## 2024-04-29 DIAGNOSIS — E1165 Type 2 diabetes mellitus with hyperglycemia: Secondary | ICD-10-CM | POA: Diagnosis not present

## 2024-05-02 DIAGNOSIS — N401 Enlarged prostate with lower urinary tract symptoms: Secondary | ICD-10-CM | POA: Diagnosis not present

## 2024-05-02 DIAGNOSIS — E7800 Pure hypercholesterolemia, unspecified: Secondary | ICD-10-CM | POA: Diagnosis not present

## 2024-05-02 DIAGNOSIS — E1122 Type 2 diabetes mellitus with diabetic chronic kidney disease: Secondary | ICD-10-CM | POA: Diagnosis not present

## 2024-05-02 DIAGNOSIS — I129 Hypertensive chronic kidney disease with stage 1 through stage 4 chronic kidney disease, or unspecified chronic kidney disease: Secondary | ICD-10-CM | POA: Diagnosis not present

## 2024-05-02 DIAGNOSIS — G4733 Obstructive sleep apnea (adult) (pediatric): Secondary | ICD-10-CM | POA: Diagnosis not present

## 2024-05-02 DIAGNOSIS — K219 Gastro-esophageal reflux disease without esophagitis: Secondary | ICD-10-CM | POA: Diagnosis not present

## 2024-05-02 DIAGNOSIS — E1142 Type 2 diabetes mellitus with diabetic polyneuropathy: Secondary | ICD-10-CM | POA: Diagnosis not present

## 2024-05-02 DIAGNOSIS — N182 Chronic kidney disease, stage 2 (mild): Secondary | ICD-10-CM | POA: Diagnosis not present

## 2024-05-10 DIAGNOSIS — Z9861 Coronary angioplasty status: Secondary | ICD-10-CM | POA: Diagnosis not present

## 2024-05-10 DIAGNOSIS — I77810 Thoracic aortic ectasia: Secondary | ICD-10-CM | POA: Diagnosis not present

## 2024-05-10 DIAGNOSIS — I2510 Atherosclerotic heart disease of native coronary artery without angina pectoris: Secondary | ICD-10-CM | POA: Diagnosis not present

## 2024-05-10 DIAGNOSIS — I10 Essential (primary) hypertension: Secondary | ICD-10-CM | POA: Diagnosis not present

## 2024-05-10 DIAGNOSIS — E7849 Other hyperlipidemia: Secondary | ICD-10-CM | POA: Diagnosis not present

## 2024-05-10 DIAGNOSIS — I440 Atrioventricular block, first degree: Secondary | ICD-10-CM | POA: Diagnosis not present

## 2024-05-22 DIAGNOSIS — I7121 Aneurysm of the ascending aorta, without rupture: Secondary | ICD-10-CM | POA: Diagnosis not present

## 2024-05-22 DIAGNOSIS — R030 Elevated blood-pressure reading, without diagnosis of hypertension: Secondary | ICD-10-CM | POA: Diagnosis not present

## 2024-05-22 DIAGNOSIS — I358 Other nonrheumatic aortic valve disorders: Secondary | ICD-10-CM | POA: Diagnosis not present

## 2024-05-22 DIAGNOSIS — I519 Heart disease, unspecified: Secondary | ICD-10-CM | POA: Diagnosis not present

## 2024-05-30 DIAGNOSIS — E1165 Type 2 diabetes mellitus with hyperglycemia: Secondary | ICD-10-CM | POA: Diagnosis not present

## 2024-06-12 DIAGNOSIS — H401133 Primary open-angle glaucoma, bilateral, severe stage: Secondary | ICD-10-CM | POA: Diagnosis not present

## 2024-06-12 DIAGNOSIS — E1165 Type 2 diabetes mellitus with hyperglycemia: Secondary | ICD-10-CM | POA: Diagnosis not present

## 2024-06-20 DIAGNOSIS — H401133 Primary open-angle glaucoma, bilateral, severe stage: Secondary | ICD-10-CM | POA: Diagnosis not present

## 2024-06-20 DIAGNOSIS — Z0100 Encounter for examination of eyes and vision without abnormal findings: Secondary | ICD-10-CM | POA: Diagnosis not present

## 2024-06-29 DIAGNOSIS — E1165 Type 2 diabetes mellitus with hyperglycemia: Secondary | ICD-10-CM | POA: Diagnosis not present

## 2024-07-02 DIAGNOSIS — Z794 Long term (current) use of insulin: Secondary | ICD-10-CM | POA: Diagnosis not present

## 2024-07-02 DIAGNOSIS — I152 Hypertension secondary to endocrine disorders: Secondary | ICD-10-CM | POA: Diagnosis not present

## 2024-07-02 DIAGNOSIS — E1169 Type 2 diabetes mellitus with other specified complication: Secondary | ICD-10-CM | POA: Diagnosis not present

## 2024-07-02 DIAGNOSIS — E785 Hyperlipidemia, unspecified: Secondary | ICD-10-CM | POA: Diagnosis not present

## 2024-07-02 DIAGNOSIS — E1159 Type 2 diabetes mellitus with other circulatory complications: Secondary | ICD-10-CM | POA: Diagnosis not present

## 2024-07-02 DIAGNOSIS — E1142 Type 2 diabetes mellitus with diabetic polyneuropathy: Secondary | ICD-10-CM | POA: Diagnosis not present

## 2024-07-30 DIAGNOSIS — E1165 Type 2 diabetes mellitus with hyperglycemia: Secondary | ICD-10-CM | POA: Diagnosis not present

## 2024-08-23 DIAGNOSIS — N182 Chronic kidney disease, stage 2 (mild): Secondary | ICD-10-CM | POA: Diagnosis not present

## 2024-08-23 DIAGNOSIS — E7800 Pure hypercholesterolemia, unspecified: Secondary | ICD-10-CM | POA: Diagnosis not present

## 2024-08-23 DIAGNOSIS — I10 Essential (primary) hypertension: Secondary | ICD-10-CM | POA: Diagnosis not present

## 2024-08-23 DIAGNOSIS — E1142 Type 2 diabetes mellitus with diabetic polyneuropathy: Secondary | ICD-10-CM | POA: Diagnosis not present

## 2024-08-30 DIAGNOSIS — E1165 Type 2 diabetes mellitus with hyperglycemia: Secondary | ICD-10-CM | POA: Diagnosis not present

## 2024-09-29 DIAGNOSIS — E1165 Type 2 diabetes mellitus with hyperglycemia: Secondary | ICD-10-CM | POA: Diagnosis not present

## 2024-10-21 DIAGNOSIS — M79674 Pain in right toe(s): Secondary | ICD-10-CM | POA: Diagnosis not present

## 2024-10-21 DIAGNOSIS — M79675 Pain in left toe(s): Secondary | ICD-10-CM | POA: Diagnosis not present

## 2024-10-21 DIAGNOSIS — B351 Tinea unguium: Secondary | ICD-10-CM | POA: Diagnosis not present

## 2024-10-21 DIAGNOSIS — Z0189 Encounter for other specified special examinations: Secondary | ICD-10-CM | POA: Diagnosis not present

## 2024-10-21 DIAGNOSIS — E119 Type 2 diabetes mellitus without complications: Secondary | ICD-10-CM | POA: Diagnosis not present

## 2024-10-24 ENCOUNTER — Other Ambulatory Visit: Payer: Self-pay

## 2024-10-24 DIAGNOSIS — L570 Actinic keratosis: Secondary | ICD-10-CM | POA: Diagnosis not present

## 2024-10-24 DIAGNOSIS — Z8546 Personal history of malignant neoplasm of prostate: Secondary | ICD-10-CM

## 2024-10-24 DIAGNOSIS — L578 Other skin changes due to chronic exposure to nonionizing radiation: Secondary | ICD-10-CM | POA: Diagnosis not present

## 2024-10-24 DIAGNOSIS — D1801 Hemangioma of skin and subcutaneous tissue: Secondary | ICD-10-CM | POA: Diagnosis not present

## 2024-10-25 ENCOUNTER — Other Ambulatory Visit: Payer: Self-pay

## 2024-10-25 DIAGNOSIS — Z8546 Personal history of malignant neoplasm of prostate: Secondary | ICD-10-CM

## 2024-10-26 LAB — PSA: Prostate Specific Ag, Serum: 0.7 ng/mL (ref 0.0–4.0)

## 2024-10-30 ENCOUNTER — Ambulatory Visit: Payer: Self-pay | Admitting: Urology

## 2024-10-30 ENCOUNTER — Encounter: Payer: Self-pay | Admitting: Urology

## 2024-10-30 VITALS — BP 114/67 | HR 84 | Ht 70.0 in | Wt 260.0 lb

## 2024-10-30 DIAGNOSIS — N401 Enlarged prostate with lower urinary tract symptoms: Secondary | ICD-10-CM

## 2024-10-30 DIAGNOSIS — R351 Nocturia: Secondary | ICD-10-CM | POA: Diagnosis not present

## 2024-10-30 DIAGNOSIS — Z8546 Personal history of malignant neoplasm of prostate: Secondary | ICD-10-CM

## 2024-10-30 DIAGNOSIS — E1165 Type 2 diabetes mellitus with hyperglycemia: Secondary | ICD-10-CM | POA: Diagnosis not present

## 2024-10-30 NOTE — Progress Notes (Signed)
 10/30/2024 1:48 PM   Nicholas Acevedo 1946-04-30 [id number]  Referring provider: Jeffie Cheryl BRAVO, MD [address] [place],  [place] [address]  Chief Complaint  Patient presents with   Elevated PSA   Urologic history: 1.  T1c adenocarcinoma prostate Biopsy August 2009 PSA 3.8; 24 cc gland Prostate cryoablation 09/2008   2.  BPH with LUTS Tamsulosin  0.4 mg daily   3.  Erectile dysfunction PDE 5 inhibitor refractory  HPI: Nicholas Acevedo is a 78 y.o. male presents for annual follow-up.  His wife was with him today.  Given a 1 month trial of Gemtesa  at last year's visit for storage related voiding symptoms.  His most bothersome symptom is nocturia every 2 hours and he states this was not effective He does have sleep apnea and has had a CPAP for several years but not used due to difficulty tolerating the mask No dysuria, gross hematuria No flank, abdominal or pelvic pain PSA 10/25/1934 stable at 0.7  PMH: Past Medical History:  Diagnosis Date   Actinic keratosis 10/31/2007   R forearm - bx proven    Basal cell carcinoma 03/15/2007   L alar groove    Basal cell carcinoma 08/20/2018   L upper med clavicle    BCC (basal cell carcinoma of skin) 11/23/2021   Left lower cheek above jaw, EDC   Cancer (HCC)    prostate   Diabetes mellitus without complication (HCC)    ED (erectile dysfunction)    Esophagitis    GERD (gastroesophageal reflux disease)    Glaucoma    Hyperlipemia    Hypertension    Impotence of organic origin    Sleep apnea    Urinary obstruction     Surgical History: Past Surgical History:  Procedure Laterality Date   COLONOSCOPY     COLONOSCOPY WITH PROPOFOL  N/A 09/12/2017   Procedure: COLONOSCOPY WITH PROPOFOL ;  Surgeon: Gaylyn Gladis PENNER, MD;  Location: [facility] ENDOSCOPY;  Service: Endoscopy;  Laterality: N/A;   COLONOSCOPY WITH PROPOFOL  N/A 07/29/2022   Procedure: COLONOSCOPY WITH PROPOFOL ;  Surgeon: Maryruth Ole DASEN, MD;  Location: [facility]  ENDOSCOPY;  Service: Endoscopy;  Laterality: N/A;   ESOPHAGOGASTRODUODENOSCOPY (EGD) WITH PROPOFOL  N/A 07/29/2022   Procedure: ESOPHAGOGASTRODUODENOSCOPY (EGD) WITH PROPOFOL ;  Surgeon: Maryruth Ole DASEN, MD;  Location: [facility] ENDOSCOPY;  Service: Endoscopy;  Laterality: N/A;   EYE SURGERY     PROSTATE CRYOABLATION      Home Medications:  Allergies as of 10/30/2024   No Known Allergies      Medication List        Accurate as of October 30, 2024  1:48 PM. If you have any questions, ask your nurse or doctor.          STOP taking these medications    aspirin EC 81 MG tablet Stopped by: Glendia JAYSON Barba   cyanocobalamin 1000 MCG tablet Stopped by: Shereese Bonnie C Michaeleen Down   Dulaglutide 0.75 MG/0.5ML Soaj Stopped by: Glendia JAYSON Barba   Gemtesa  75 MG Tabs Generic drug: Vibegron  Stopped by: Glendia JAYSON Barba       TAKE these medications    atorvastatin 40 MG tablet Commonly known as: LIPITOR Take 40 mg by mouth daily.   brimonidine 0.2 % ophthalmic solution Commonly known as: ALPHAGAN Place 1 drop into both eyes 3 (three) times daily.   clopidogrel 75 MG tablet Commonly known as: PLAVIX Take 75 mg by mouth daily.   dorzolamide-timolol 2-0.5 % ophthalmic solution Commonly known as: COSOPT  Embecta Pen Needle Nano 2 Gen 32G X 4 MM Misc Generic drug: Insulin Pen Needle SMARTSIG:1 pen needle Daily   empagliflozin 25 MG Tabs tablet Commonly known as: JARDIANCE Take 25 mg by mouth daily. 25 mg tab take half a tab (12.5mg )  daily   Fingerstix Lancets Misc 1 each by Other route.   glipiZIDE 5 MG tablet Commonly known as: GLUCOTROL TAKE 1 TABLET BY MOUTH TWICE DAILY BEFORE MEAL(S)   hydrocortisone  2.5 % cream Use as directed. Instructions in handout   Lantus SoloStar 100 UNIT/ML Solostar Pen Generic drug: insulin glargine Inject 45 Units into the skin daily. AM   latanoprost 0.005 % ophthalmic solution Commonly known as: XALATAN 1 drop at bedtime.   losartan 50 MG  tablet Commonly known as: COZAAR Take 50 mg by mouth daily.   metFORMIN 1000 MG tablet Commonly known as: GLUCOPHAGE Take 1,000 mg by mouth 2 (two) times daily with a meal.   Mounjaro 5 MG/0.5ML Pen Generic drug: tirzepatide Inject 5 mg into the skin once a week.   omeprazole 20 MG tablet Commonly known as: PRILOSEC OTC Take by mouth.   omeprazole 40 MG capsule Commonly known as: PRILOSEC Take 40 mg by mouth daily.   ONE TOUCH ULTRA TEST test strip Generic drug: glucose blood USE ONE STRIP TO CHECK GLUCOSE ONCE DAILY   Rocklatan 0.02-0.005 % Soln Generic drug: Netarsudil-Latanoprost 1 drop nightly   tamsulosin  0.4 MG Caps capsule Commonly known as: FLOMAX  Take 1 capsule by mouth once daily        Allergies: No Known Allergies  Family History: History reviewed. No pertinent family history.  Social History:  reports that he has never smoked. He has never used smokeless tobacco. He reports that he does not drink alcohol and does not use drugs.   Physical Exam: BP 114/67   Pulse 84   Ht 5' 10 (1.778 m)   Wt 260 lb (117.9 kg)   BMI 37.31 kg/m   Constitutional:  Alert, No acute distress. HEENT: [place] AT Respiratory: Normal respiratory effort, no increased work of breathing. Psychiatric: Normal mood and affect.    Assessment & Plan:    1. BPH with LUTS Continue tamsulosin   Nocturia is his most bothersome symptom and we discussed untreated sleep apnea is a very common cause of nocturia.  His current CPAP is several years old and recommended he check with whoever prescribed his device as there may be newer bottles that are more comfortable.   2. History of prostate cancer Stable PSA Due to length out from prostate cancer treatment and age I recommended discontinuing PSA checks His wife states he does get a wellness benefit from an insurance policy he has.  He gets annual blood work with his PCP and recommend he request PSA at that visit   Glendia JAYSON Barba,  MD  [facility] [address] [place], [place] [address] [phone]

## 2024-11-01 DIAGNOSIS — H401133 Primary open-angle glaucoma, bilateral, severe stage: Secondary | ICD-10-CM | POA: Diagnosis not present

## 2024-11-06 DIAGNOSIS — E1142 Type 2 diabetes mellitus with diabetic polyneuropathy: Secondary | ICD-10-CM | POA: Diagnosis not present

## 2024-11-06 DIAGNOSIS — E7800 Pure hypercholesterolemia, unspecified: Secondary | ICD-10-CM | POA: Diagnosis not present

## 2024-11-11 DIAGNOSIS — I77810 Thoracic aortic ectasia: Secondary | ICD-10-CM | POA: Diagnosis not present

## 2024-11-11 DIAGNOSIS — E7849 Other hyperlipidemia: Secondary | ICD-10-CM | POA: Diagnosis not present

## 2024-11-11 DIAGNOSIS — I440 Atrioventricular block, first degree: Secondary | ICD-10-CM | POA: Diagnosis not present

## 2024-11-11 DIAGNOSIS — Z955 Presence of coronary angioplasty implant and graft: Secondary | ICD-10-CM | POA: Diagnosis not present

## 2024-11-11 DIAGNOSIS — I2510 Atherosclerotic heart disease of native coronary artery without angina pectoris: Secondary | ICD-10-CM | POA: Diagnosis not present

## 2024-11-11 DIAGNOSIS — I10 Essential (primary) hypertension: Secondary | ICD-10-CM | POA: Diagnosis not present

## 2024-11-13 DIAGNOSIS — Z0000 Encounter for general adult medical examination without abnormal findings: Secondary | ICD-10-CM | POA: Diagnosis not present

## 2024-11-13 DIAGNOSIS — I129 Hypertensive chronic kidney disease with stage 1 through stage 4 chronic kidney disease, or unspecified chronic kidney disease: Secondary | ICD-10-CM | POA: Diagnosis not present

## 2024-11-13 DIAGNOSIS — N401 Enlarged prostate with lower urinary tract symptoms: Secondary | ICD-10-CM | POA: Diagnosis not present

## 2024-11-13 DIAGNOSIS — E7800 Pure hypercholesterolemia, unspecified: Secondary | ICD-10-CM | POA: Diagnosis not present

## 2024-11-13 DIAGNOSIS — K219 Gastro-esophageal reflux disease without esophagitis: Secondary | ICD-10-CM | POA: Diagnosis not present

## 2024-11-13 DIAGNOSIS — E1122 Type 2 diabetes mellitus with diabetic chronic kidney disease: Secondary | ICD-10-CM | POA: Diagnosis not present

## 2024-11-13 DIAGNOSIS — E1142 Type 2 diabetes mellitus with diabetic polyneuropathy: Secondary | ICD-10-CM | POA: Diagnosis not present

## 2024-11-13 DIAGNOSIS — Z1331 Encounter for screening for depression: Secondary | ICD-10-CM | POA: Diagnosis not present

## 2024-11-13 DIAGNOSIS — G4733 Obstructive sleep apnea (adult) (pediatric): Secondary | ICD-10-CM | POA: Diagnosis not present

## 2024-12-16 ENCOUNTER — Other Ambulatory Visit: Payer: Self-pay | Admitting: Urology

## 2025-10-31 ENCOUNTER — Ambulatory Visit: Admitting: Urology
